# Patient Record
Sex: Female | Born: 1935 | Race: White | Hispanic: No | Smoking: Former smoker
Health system: Southern US, Community
[De-identification: ages and names within clinical notes are randomized; demographics above are authoritative.]

## PROBLEM LIST (undated history)

## (undated) DIAGNOSIS — R531 Weakness: Secondary | ICD-10-CM

## (undated) DIAGNOSIS — I639 Cerebral infarction, unspecified: Secondary | ICD-10-CM

## (undated) DIAGNOSIS — F32A Depression, unspecified: Secondary | ICD-10-CM

## (undated) DIAGNOSIS — J449 Chronic obstructive pulmonary disease, unspecified: Secondary | ICD-10-CM

## (undated) DIAGNOSIS — J309 Allergic rhinitis, unspecified: Secondary | ICD-10-CM

## (undated) DIAGNOSIS — I2699 Other pulmonary embolism without acute cor pulmonale: Secondary | ICD-10-CM

## (undated) DIAGNOSIS — R609 Edema, unspecified: Secondary | ICD-10-CM

## (undated) DIAGNOSIS — M1990 Unspecified osteoarthritis, unspecified site: Secondary | ICD-10-CM

## (undated) DIAGNOSIS — Z9989 Dependence on other enabling machines and devices: Secondary | ICD-10-CM

## (undated) DIAGNOSIS — R32 Unspecified urinary incontinence: Secondary | ICD-10-CM

## (undated) DIAGNOSIS — G629 Polyneuropathy, unspecified: Secondary | ICD-10-CM

## (undated) DIAGNOSIS — I82409 Acute embolism and thrombosis of unspecified deep veins of unspecified lower extremity: Secondary | ICD-10-CM

## (undated) DIAGNOSIS — F419 Anxiety disorder, unspecified: Secondary | ICD-10-CM

## (undated) DIAGNOSIS — E785 Hyperlipidemia, unspecified: Secondary | ICD-10-CM

## (undated) DIAGNOSIS — F329 Major depressive disorder, single episode, unspecified: Secondary | ICD-10-CM

## (undated) DIAGNOSIS — E119 Type 2 diabetes mellitus without complications: Secondary | ICD-10-CM

## (undated) DIAGNOSIS — I872 Venous insufficiency (chronic) (peripheral): Secondary | ICD-10-CM

## (undated) DIAGNOSIS — I509 Heart failure, unspecified: Secondary | ICD-10-CM

## (undated) HISTORY — PX: CHOLECYSTECTOMY: SHX55

## (undated) HISTORY — PX: APPENDECTOMY: SHX54

## (undated) HISTORY — PX: CATARACT EXTRACTION, BILATERAL: SHX1313

---

## 2012-03-14 ENCOUNTER — Emergency Department (HOSPITAL_COMMUNITY): Payer: Medicare Other

## 2012-03-14 ENCOUNTER — Emergency Department (HOSPITAL_COMMUNITY)
Admission: EM | Admit: 2012-03-14 | Discharge: 2012-03-14 | Disposition: A | Discharge status: Home / Self Care | Payer: Medicare Other | Attending: Emergency Medicine | Admitting: Emergency Medicine

## 2012-03-14 ENCOUNTER — Encounter (HOSPITAL_COMMUNITY): Payer: Self-pay | Admitting: *Deleted

## 2012-03-14 DIAGNOSIS — R609 Edema, unspecified: Secondary | ICD-10-CM | POA: Insufficient documentation

## 2012-03-14 DIAGNOSIS — W19XXXA Unspecified fall, initial encounter: Secondary | ICD-10-CM

## 2012-03-14 DIAGNOSIS — Y9210 Unspecified residential institution as the place of occurrence of the external cause: Secondary | ICD-10-CM | POA: Insufficient documentation

## 2012-03-14 DIAGNOSIS — Z86718 Personal history of other venous thrombosis and embolism: Secondary | ICD-10-CM | POA: Insufficient documentation

## 2012-03-14 DIAGNOSIS — W1809XA Striking against other object with subsequent fall, initial encounter: Secondary | ICD-10-CM | POA: Insufficient documentation

## 2012-03-14 DIAGNOSIS — I6990 Unspecified sequelae of unspecified cerebrovascular disease: Secondary | ICD-10-CM | POA: Insufficient documentation

## 2012-03-14 DIAGNOSIS — T1490XA Injury, unspecified, initial encounter: Secondary | ICD-10-CM | POA: Insufficient documentation

## 2012-03-14 DIAGNOSIS — M25579 Pain in unspecified ankle and joints of unspecified foot: Secondary | ICD-10-CM | POA: Insufficient documentation

## 2012-03-14 DIAGNOSIS — E119 Type 2 diabetes mellitus without complications: Secondary | ICD-10-CM | POA: Insufficient documentation

## 2012-03-14 DIAGNOSIS — Z7901 Long term (current) use of anticoagulants: Secondary | ICD-10-CM | POA: Insufficient documentation

## 2012-03-14 DIAGNOSIS — Z794 Long term (current) use of insulin: Secondary | ICD-10-CM | POA: Insufficient documentation

## 2012-03-14 DIAGNOSIS — S0003XA Contusion of scalp, initial encounter: Secondary | ICD-10-CM | POA: Insufficient documentation

## 2012-03-14 HISTORY — DX: Type 2 diabetes mellitus without complications: E11.9

## 2012-03-14 HISTORY — DX: Weakness: R53.1

## 2012-03-14 HISTORY — DX: Major depressive disorder, single episode, unspecified: F32.9

## 2012-03-14 HISTORY — DX: Depression, unspecified: F32.A

## 2012-03-14 HISTORY — DX: Chronic obstructive pulmonary disease, unspecified: J44.9

## 2012-03-14 HISTORY — DX: Polyneuropathy, unspecified: G62.9

## 2012-03-14 HISTORY — DX: Unspecified urinary incontinence: R32

## 2012-03-14 HISTORY — DX: Heart failure, unspecified: I50.9

## 2012-03-14 HISTORY — DX: Acute embolism and thrombosis of unspecified deep veins of unspecified lower extremity: I82.409

## 2012-03-14 HISTORY — DX: Anxiety disorder, unspecified: F41.9

## 2012-03-14 HISTORY — DX: Hyperlipidemia, unspecified: E78.5

## 2012-03-14 HISTORY — DX: Venous insufficiency (chronic) (peripheral): I87.2

## 2012-03-14 HISTORY — DX: Dependence on other enabling machines and devices: Z99.89

## 2012-03-14 HISTORY — DX: Other pulmonary embolism without acute cor pulmonale: I26.99

## 2012-03-14 HISTORY — DX: Edema, unspecified: R60.9

## 2012-03-14 HISTORY — DX: Unspecified osteoarthritis, unspecified site: M19.90

## 2012-03-14 HISTORY — DX: Allergic rhinitis, unspecified: J30.9

## 2012-03-14 HISTORY — DX: Cerebral infarction, unspecified: I63.9

## 2012-03-14 LAB — PROTIME-INR
INR: 1.88 — ABNORMAL HIGH (ref 0.00–1.49)
Prothrombin Time: 20.9 seconds — ABNORMAL HIGH (ref 11.6–15.2)

## 2012-03-14 NOTE — ED Provider Notes (Signed)
History     CSN: [id number]  Arrival date & time 03/14/12  1428   First MD Initiated Contact with Patient 03/14/12 1534      Chief Complaint  Patient presents with  . Fall    (Consider location/radiation/quality/duration/timing/severity/associated sxs/prior treatment) HPI patient fell 1 30 p.m. today attempting to transfer herself from a chair to a wheelchair. As a result fall she struck her head. No loss of consciousness her only complaint presently his left ankle pain she suffered a hematoma to her head there was a event no loss of consciousness no neck pain no other complaint. No treatment prior to coming here sent here for evaluation Past Medical History  Diagnosis Date  . Stroke   . History of blood clots   . Neuropathy   . Swelling   . General weakness   . CHF (congestive heart failure)   . CPAP (continuous positive airway pressure) dependence   . Diabetes mellitus without complication   . Allergic rhinitis   . Hyperlipidemia   . Venous insufficiency   . Urinary incontinence   . Anxiety   . Depression   . Osteoarthritis   . COPD (chronic obstructive pulmonary disease)   . DVT (deep venous thrombosis)   . PE (pulmonary embolism)     Past Surgical History  Procedure Date  . Cholecystectomy   . Appendectomy     History reviewed. No pertinent family history.  History  Substance Use Topics  . Smoking status: Never Smoker   . Smokeless tobacco: Never Used  . Alcohol Use: No    OB History    Grav Para Term Preterm Abortions TAB SAB Ect Mult Living                  Review of Systems  Musculoskeletal: Positive for arthralgias and gait problem.       Wheelchair-bound left ankle pain  All other systems reviewed and are negative.    Allergies  Pollen extract  Home Medications   Current Outpatient Rx  Name Route Sig Dispense Refill  . ACETAMINOPHEN 325 MG PO TABS Oral Take 650 mg by mouth every 6 (six) hours as needed. PAIN    . ALBUTEROL SULFATE  (2.5 MG/3ML) 0.083% IN NEBU Nebulization Take 2.5 mg by nebulization every 6 (six) hours as needed. WHEEZING AND SHORTNESS OF BREATH    . AMLODIPINE BESYLATE 5 MG PO TABS Oral Take 5 mg by mouth daily.    . BUSPIRONE HCL 15 MG PO TABS Oral Take 15 mg by mouth 2 (two) times daily.    [name] VITAMIN D 1000 UNITS PO TABS Oral Take 1,000 Units by mouth daily.    [name] DOCUSATE SODIUM 100 MG PO CAPS Oral Take 100 mg by mouth daily.    [name] ESCITALOPRAM OXALATE 10 MG PO TABS Oral Take 10 mg by mouth daily.    [name] FEXOFENADINE HCL 180 MG PO TABS Oral Take 180 mg by mouth daily.    . FUROSEMIDE 20 MG PO TABS Oral Take 20 mg by mouth daily.    . GUAIFENESIN ER 600 MG PO TB12 Oral Take 1,200 mg by mouth 2 (two) times daily.    . INSULIN GLARGINE 100 UNIT/ML [place] SOLN Subcutaneous Inject 40 Units into the skin 2 (two) times daily.    . INSULIN LISPRO (HUMAN) 100 UNIT/ML [place] SOLN Subcutaneous Inject 20 Units into the skin 3 (three) times daily before meals.    [name] LEVOTHYROXINE SODIUM 25 MCG PO TABS Oral  Take 25 mcg by mouth daily.    . MOMETASONE FUROATE 220 MCG/INH IN AEPB Inhalation Inhale 2 puffs into the lungs daily.    [name] OMEPRAZOLE 20 MG PO CPDR Oral Take 20 mg by mouth at bedtime.    . OXYBUTYNIN CHLORIDE 5 MG PO TABS Oral Take 10 mg by mouth at bedtime.    . SUCRALFATE 1 G PO TABS Oral Take 1 g by mouth at bedtime.    . SULFAMETHOXAZOLE-TMP DS 800-160 MG PO TABS Oral Take 1 tablet by mouth daily.    . TRAMADOL HCL 50 MG PO TABS Oral Take 50 mg by mouth every 4 (four) hours as needed. PAIN    . WARFARIN SODIUM 3 MG PO TABS Oral Take 3 mg by mouth daily.      BP 133/58  Pulse 100  Temp 98 F (36.7 C) (Oral)  Resp 18  SpO2 100%  Physical Exam  Nursing note and vitals reviewed. Constitutional: She is oriented to person, place, and time. She appears well-developed and well-nourished. No distress.  HENT:       Golf ball sized hematoma right forehead otherwise atraumatic  Eyes: Conjunctivae normal are normal.  Pupils are equal, round, and reactive to light.  Neck: Neck supple. No tracheal deviation present. No thyromegaly present.  Cardiovascular: Normal rate and regular rhythm.   No murmur heard. Pulmonary/Chest: Effort normal and breath sounds normal.  Abdominal: Soft. Bowel sounds are normal. She exhibits no distension. There is no tenderness.  Musculoskeletal: Normal range of motion. She exhibits no edema and no tenderness.       Bilateral lower extremities with 3+ pretibial pitting edema of lower extremity minimally tender at the medial malleolus DP pulse 2+ skin intact  Neurological: She is alert and oriented to person, place, and time. Coordination normal.  Skin: Skin is warm and dry. No rash noted.  Psychiatric: She has a normal mood and affect.    ED Course  Procedures (including critical care time)  Labs Reviewed - No data to display No results found.   No diagnosis found.  Declined pain medicine  AS0 splint placed on left ankle by orthopedic technician. Splint is comfortable for patient. Patient requests Dr. Luiz Blare for orthopedic followup ankle xray reviewed by me MDM  INR is mildly subtherapeutic however we will not adjust dose now in light of recent minor head trauma  Plan Tylenol for pain Patient called PMD tomorrow for followup Followup Dr. Luiz Blare  Diagnosis #1 fall #2 minor closed head trauma #3 closed fracture left ankle #4 subtherapeutic INR     Doug Sou, MD 03/14/12 1857

## 2012-03-14 NOTE — ED Notes (Signed)
Patient is resting comfortably. 

## 2012-03-14 NOTE — ED Notes (Signed)
Patient transported to X-ray 

## 2012-03-14 NOTE — ED Notes (Signed)
Family at bedside. 

## 2012-03-14 NOTE — ED Notes (Signed)
Pt states she has limited mobility in her R leg d/t previous stroke, was transferring from chair to chair and then landed on the floor, Pt landed face first on the floor, R side bruising and hematoma noted to forehead. Pt denies pain, blurred vision.

## 2012-03-14 NOTE — ED Notes (Signed)
MD at bedside. 

## 2012-03-14 NOTE — ED Notes (Signed)
Per EMS pt was transferring from recliner to wheelchair, slipped and hit head on wall, no complaints from pt, no lacerations or bruising noted, pt is on coumadin, pelvis/hip area hit floor first, no LOC, alert/oriented. Pt from [facility]. BP 146/72, HR 78, CBG 100 taken by staff at facility.

## 2012-03-14 NOTE — ED Notes (Signed)
AVW:[id number]<XB> Expected date:03/14/12<BR> Expected time: 2:25 PM<BR> Means of arrival:Ambulance<BR> Comments:<BR> Fall

## 2012-06-30 ENCOUNTER — Encounter (HOSPITAL_COMMUNITY): Payer: Self-pay | Admitting: Emergency Medicine

## 2012-06-30 ENCOUNTER — Emergency Department (HOSPITAL_COMMUNITY)
Admission: EM | Admit: 2012-06-30 | Discharge: 2012-06-30 | Disposition: A | Discharge status: Home / Self Care | Payer: Medicare Other | Attending: Emergency Medicine | Admitting: Emergency Medicine

## 2012-06-30 DIAGNOSIS — Z8669 Personal history of other diseases of the nervous system and sense organs: Secondary | ICD-10-CM | POA: Insufficient documentation

## 2012-06-30 DIAGNOSIS — E785 Hyperlipidemia, unspecified: Secondary | ICD-10-CM | POA: Insufficient documentation

## 2012-06-30 DIAGNOSIS — I2699 Other pulmonary embolism without acute cor pulmonale: Secondary | ICD-10-CM | POA: Insufficient documentation

## 2012-06-30 DIAGNOSIS — Z7901 Long term (current) use of anticoagulants: Secondary | ICD-10-CM | POA: Insufficient documentation

## 2012-06-30 DIAGNOSIS — Z8673 Personal history of transient ischemic attack (TIA), and cerebral infarction without residual deficits: Secondary | ICD-10-CM | POA: Insufficient documentation

## 2012-06-30 DIAGNOSIS — Z8709 Personal history of other diseases of the respiratory system: Secondary | ICD-10-CM | POA: Insufficient documentation

## 2012-06-30 DIAGNOSIS — Z86711 Personal history of pulmonary embolism: Secondary | ICD-10-CM | POA: Insufficient documentation

## 2012-06-30 DIAGNOSIS — Z79899 Other long term (current) drug therapy: Secondary | ICD-10-CM | POA: Insufficient documentation

## 2012-06-30 DIAGNOSIS — Y9210 Unspecified residential institution as the place of occurrence of the external cause: Secondary | ICD-10-CM | POA: Insufficient documentation

## 2012-06-30 DIAGNOSIS — S0990XA Unspecified injury of head, initial encounter: Secondary | ICD-10-CM | POA: Insufficient documentation

## 2012-06-30 DIAGNOSIS — R5383 Other fatigue: Secondary | ICD-10-CM | POA: Insufficient documentation

## 2012-06-30 DIAGNOSIS — Z86718 Personal history of other venous thrombosis and embolism: Secondary | ICD-10-CM | POA: Insufficient documentation

## 2012-06-30 DIAGNOSIS — Z8739 Personal history of other diseases of the musculoskeletal system and connective tissue: Secondary | ICD-10-CM | POA: Insufficient documentation

## 2012-06-30 DIAGNOSIS — I509 Heart failure, unspecified: Secondary | ICD-10-CM | POA: Insufficient documentation

## 2012-06-30 DIAGNOSIS — R32 Unspecified urinary incontinence: Secondary | ICD-10-CM | POA: Insufficient documentation

## 2012-06-30 DIAGNOSIS — F3289 Other specified depressive episodes: Secondary | ICD-10-CM | POA: Insufficient documentation

## 2012-06-30 DIAGNOSIS — F329 Major depressive disorder, single episode, unspecified: Secondary | ICD-10-CM | POA: Insufficient documentation

## 2012-06-30 DIAGNOSIS — Z794 Long term (current) use of insulin: Secondary | ICD-10-CM | POA: Insufficient documentation

## 2012-06-30 DIAGNOSIS — F411 Generalized anxiety disorder: Secondary | ICD-10-CM | POA: Insufficient documentation

## 2012-06-30 DIAGNOSIS — J449 Chronic obstructive pulmonary disease, unspecified: Secondary | ICD-10-CM | POA: Insufficient documentation

## 2012-06-30 DIAGNOSIS — J4489 Other specified chronic obstructive pulmonary disease: Secondary | ICD-10-CM | POA: Insufficient documentation

## 2012-06-30 DIAGNOSIS — Y9389 Activity, other specified: Secondary | ICD-10-CM | POA: Insufficient documentation

## 2012-06-30 DIAGNOSIS — W06XXXA Fall from bed, initial encounter: Secondary | ICD-10-CM | POA: Insufficient documentation

## 2012-06-30 DIAGNOSIS — Z8679 Personal history of other diseases of the circulatory system: Secondary | ICD-10-CM | POA: Insufficient documentation

## 2012-06-30 DIAGNOSIS — E119 Type 2 diabetes mellitus without complications: Secondary | ICD-10-CM | POA: Insufficient documentation

## 2012-06-30 DIAGNOSIS — Z87448 Personal history of other diseases of urinary system: Secondary | ICD-10-CM | POA: Insufficient documentation

## 2012-06-30 DIAGNOSIS — R5381 Other malaise: Secondary | ICD-10-CM | POA: Insufficient documentation

## 2012-06-30 NOTE — ED Notes (Signed)
Dr. Miller @ bedside.

## 2012-06-30 NOTE — ED Notes (Addendum)
Brought in by EMS from [facility] facility after pt fell off bed.  Per EMS:  Pt slid on the floor when she was getting out of bed, pt reported to staff that "she might have hit head on the carpet", pt c/o "carpet burn" on her forehead; pt arrived to ED room with LSB; pt presents to ED alert and oriented, denies pain.

## 2012-06-30 NOTE — ED Provider Notes (Signed)
History     CSN: [id number]  Arrival date & time 06/30/12  0865   First MD Initiated Contact with Patient 06/30/12 [phone]      Chief Complaint  Patient presents with  . Fall    (Consider location/radiation/quality/duration/timing/severity/associated sxs/prior treatment) HPI Comments: 79 year old female with a history of diabetes, venous thromboembolism requiring Coumadin therapy, congestive heart failure and general weakness presents from her nursing facility where she states that she had trouble getting out of bed this morning, felt like she was losing her balance and "slithered" to the floor from the edge of her bed. Paramedics were called to transfer the patient at the hospital because she bumped her head on the ground which she equally to a rug burn. The patient denies headache, blurred vision, weakness or numbness at this time and has a clear sensorium. This was acute in onset just prior to arrival, symptoms are persistent, mild and not associated with vomiting  Patient is a 79 y.o. female presenting with fall. The history is provided by the patient and the EMS personnel.  Fall    Past Medical History  Diagnosis Date  . Stroke   . History of blood clots   . Neuropathy   . Swelling   . General weakness   . CHF (congestive heart failure)   . CPAP (continuous positive airway pressure) dependence   . Diabetes mellitus without complication   . Allergic rhinitis   . Hyperlipidemia   . Venous insufficiency   . Urinary incontinence   . Anxiety   . Depression   . Osteoarthritis   . COPD (chronic obstructive pulmonary disease)   . DVT (deep venous thrombosis)   . PE (pulmonary embolism)     Past Surgical History  Procedure Date  . Cholecystectomy   . Appendectomy     No family history on file.  History  Substance Use Topics  . Smoking status: Never Smoker   . Smokeless tobacco: Never Used  . Alcohol Use: No    OB History    Grav Para Term Preterm Abortions TAB SAB  Ect Mult Living                  Review of Systems  All other systems reviewed and are negative.    Allergies  Pollen extract  Home Medications   Current Outpatient Rx  Name  Route  Sig  Dispense  Refill  . ACETAMINOPHEN 325 MG PO TABS   Oral   Take 650 mg by mouth every 6 (six) hours as needed. PAIN         . ALBUTEROL SULFATE (2.5 MG/3ML) 0.083% IN NEBU   Nebulization   Take 2.5 mg by nebulization every 6 (six) hours as needed. WHEEZING AND SHORTNESS OF BREATH         . AMLODIPINE BESYLATE 5 MG PO TABS   Oral   Take 5 mg by mouth daily.         . BUSPIRONE HCL 15 MG PO TABS   Oral   Take 15 mg by mouth 2 (two) times daily.         [name] VITAMIN D 1000 UNITS PO TABS   Oral   Take 1,000 Units by mouth daily.         [name] DOCUSATE SODIUM 100 MG PO CAPS   Oral   Take 100 mg by mouth daily.         [name] ESCITALOPRAM OXALATE 10 MG PO TABS   Oral  Take 10 mg by mouth daily.         [name] FEXOFENADINE HCL 180 MG PO TABS   Oral   Take 180 mg by mouth daily.         . FUROSEMIDE 20 MG PO TABS   Oral   Take 20 mg by mouth daily.         . GUAIFENESIN ER 600 MG PO TB12   Oral   Take 1,200 mg by mouth 2 (two) times daily.         . INSULIN GLARGINE 100 UNIT/ML [place] SOLN   Subcutaneous   Inject 40 Units into the skin 2 (two) times daily.         . INSULIN LISPRO (HUMAN) 100 UNIT/ML  SOLN   Subcutaneous   Inject 20 Units into the skin 3 (three) times daily before meals.         [name] LEVOTHYROXINE SODIUM 25 MCG PO TABS   Oral   Take 25 mcg by mouth daily.         . MOMETASONE FUROATE 220 MCG/INH IN AEPB   Inhalation   Inhale 2 puffs into the lungs daily.         [name] OMEPRAZOLE 20 MG PO CPDR   Oral   Take 20 mg by mouth at bedtime.         . OXYBUTYNIN CHLORIDE 5 MG PO TABS   Oral   Take 10 mg by mouth at bedtime.         . SUCRALFATE 1 G PO TABS   Oral   Take 1 g by mouth at bedtime.         . SULFAMETHOXAZOLE-TMP DS 800-160 MG PO  TABS   Oral   Take 1 tablet by mouth daily.         . TRAMADOL HCL 50 MG PO TABS   Oral   Take 50 mg by mouth every 4 (four) hours as needed. PAIN         . WARFARIN SODIUM 3 MG PO TABS   Oral   Take 3 mg by mouth daily.           BP 146/39  Pulse 62  Resp 21  SpO2 96%  Physical Exam  Nursing note and vitals reviewed. Constitutional: She appears well-developed and well-nourished. No distress.  HENT:  Head: Normocephalic.  Mouth/Throat: Oropharynx is clear and moist. No oropharyngeal exudate.       Minimal redness to the skin of the right 4 head, no hematoma, no laceration, no hemotympanum  no facial tenderness, deformity, malocclusion or hemotympanum.  no battle's sign or racoon eyes.   Eyes: Conjunctivae normal and EOM are normal. Pupils are equal, round, and reactive to light. Right eye exhibits no discharge. Left eye exhibits no discharge. No scleral icterus.  Neck: Normal range of motion. Neck supple. No JVD present. No thyromegaly present.  Cardiovascular: Normal rate, regular rhythm, normal heart sounds and intact distal pulses.  Exam reveals no gallop and no friction rub.   No murmur heard. Pulmonary/Chest: Effort normal and breath sounds normal. No respiratory distress. She has no wheezes. She has no rales.  Abdominal: Soft. Bowel sounds are normal. She exhibits no distension and no mass. There is no tenderness.  Musculoskeletal: Normal range of motion. She exhibits no edema and no tenderness.  Lymphadenopathy:    She has no cervical adenopathy.  Neurological: She is alert. Coordination normal.       Speech  is clear, memory is intact, follows commands with all 4 extremities, normal strength of all 4 extremities, cranial nerves III through XII are intact normal coordination and no limb ataxia  Skin: Skin is warm and dry. No rash noted. No erythema.  Psychiatric: She has a normal mood and affect. Her behavior is normal.    ED Course  Procedures (including  critical care time)  Labs Reviewed - No data to display No results found.   1. Minor head injury       MDM  Well-appearing, minor head injury, no indication for imaging at this time as the patient is completely asymptomatic. In addition though she has generalized weakness she has no focal weakness on my exam, she declines imaging stating that she feels comfortable going home, I agree with the patient at this time they recommended she return for severe or worsening symptoms including weakness, change in vision, worsening headache.        Vida Roller, MD 06/30/12 [phone]

## 2012-06-30 NOTE — ED Notes (Signed)
Bed:WA15<BR> Expected date:<BR> Expected time:<BR> Means of arrival:<BR> Comments:<BR> EMS

## 2012-12-09 ENCOUNTER — Emergency Department (HOSPITAL_COMMUNITY)
Admission: EM | Admit: 2012-12-09 | Discharge: 2012-12-09 | Disposition: A | Discharge status: Home / Self Care | Payer: Medicare Other | Attending: Emergency Medicine | Admitting: Emergency Medicine

## 2012-12-09 ENCOUNTER — Encounter (HOSPITAL_COMMUNITY): Payer: Self-pay | Admitting: Emergency Medicine

## 2012-12-09 ENCOUNTER — Emergency Department (HOSPITAL_COMMUNITY): Payer: Medicare Other

## 2012-12-09 DIAGNOSIS — Z86718 Personal history of other venous thrombosis and embolism: Secondary | ICD-10-CM | POA: Insufficient documentation

## 2012-12-09 DIAGNOSIS — F329 Major depressive disorder, single episode, unspecified: Secondary | ICD-10-CM | POA: Insufficient documentation

## 2012-12-09 DIAGNOSIS — F3289 Other specified depressive episodes: Secondary | ICD-10-CM | POA: Insufficient documentation

## 2012-12-09 DIAGNOSIS — Z79899 Other long term (current) drug therapy: Secondary | ICD-10-CM | POA: Insufficient documentation

## 2012-12-09 DIAGNOSIS — Z8673 Personal history of transient ischemic attack (TIA), and cerebral infarction without residual deficits: Secondary | ICD-10-CM | POA: Insufficient documentation

## 2012-12-09 DIAGNOSIS — Y9210 Unspecified residential institution as the place of occurrence of the external cause: Secondary | ICD-10-CM | POA: Insufficient documentation

## 2012-12-09 DIAGNOSIS — S0990XA Unspecified injury of head, initial encounter: Secondary | ICD-10-CM | POA: Insufficient documentation

## 2012-12-09 DIAGNOSIS — Z86711 Personal history of pulmonary embolism: Secondary | ICD-10-CM | POA: Insufficient documentation

## 2012-12-09 DIAGNOSIS — Z8679 Personal history of other diseases of the circulatory system: Secondary | ICD-10-CM | POA: Insufficient documentation

## 2012-12-09 DIAGNOSIS — Z8739 Personal history of other diseases of the musculoskeletal system and connective tissue: Secondary | ICD-10-CM | POA: Insufficient documentation

## 2012-12-09 DIAGNOSIS — J449 Chronic obstructive pulmonary disease, unspecified: Secondary | ICD-10-CM | POA: Insufficient documentation

## 2012-12-09 DIAGNOSIS — I509 Heart failure, unspecified: Secondary | ICD-10-CM | POA: Insufficient documentation

## 2012-12-09 DIAGNOSIS — Y92129 Unspecified place in nursing home as the place of occurrence of the external cause: Secondary | ICD-10-CM

## 2012-12-09 DIAGNOSIS — Z794 Long term (current) use of insulin: Secondary | ICD-10-CM | POA: Insufficient documentation

## 2012-12-09 DIAGNOSIS — Z87448 Personal history of other diseases of urinary system: Secondary | ICD-10-CM | POA: Insufficient documentation

## 2012-12-09 DIAGNOSIS — E785 Hyperlipidemia, unspecified: Secondary | ICD-10-CM | POA: Insufficient documentation

## 2012-12-09 DIAGNOSIS — E119 Type 2 diabetes mellitus without complications: Secondary | ICD-10-CM | POA: Insufficient documentation

## 2012-12-09 DIAGNOSIS — Z8709 Personal history of other diseases of the respiratory system: Secondary | ICD-10-CM | POA: Insufficient documentation

## 2012-12-09 DIAGNOSIS — Y939 Activity, unspecified: Secondary | ICD-10-CM | POA: Insufficient documentation

## 2012-12-09 DIAGNOSIS — F411 Generalized anxiety disorder: Secondary | ICD-10-CM | POA: Insufficient documentation

## 2012-12-09 DIAGNOSIS — J4489 Other specified chronic obstructive pulmonary disease: Secondary | ICD-10-CM | POA: Insufficient documentation

## 2012-12-09 DIAGNOSIS — W050XXA Fall from non-moving wheelchair, initial encounter: Secondary | ICD-10-CM | POA: Insufficient documentation

## 2012-12-09 DIAGNOSIS — Z7901 Long term (current) use of anticoagulants: Secondary | ICD-10-CM | POA: Insufficient documentation

## 2012-12-09 LAB — GLUCOSE, CAPILLARY: Glucose-Capillary: 86 mg/dL (ref 70–99)

## 2012-12-09 NOTE — ED Notes (Signed)
Pt's son doesn't understand why we are doing collecting BMP and starting an IV and dont want her to be stuck until the see the EDP.  Pt's son requesting to speak with EDP. Made Nanavati aware.

## 2012-12-09 NOTE — ED Notes (Signed)
Pt from [facility] with c/o of fall while transferring to wheelchair. Staff thinks pt hit head on floor. Hx of dementia. NAD at this time.

## 2012-12-09 NOTE — ED Notes (Signed)
After alerting RN Lyla Son that patient's cbg was 39,patient given orange juice and [place] sandwich.

## 2012-12-09 NOTE — ED Provider Notes (Signed)
History    CSN: [id number] Arrival date & time 12/09/12  1222  First MD Initiated Contact with Patient 12/09/12 1237     Chief Complaint  Patient presents with  . Fall   (Consider location/radiation/quality/duration/timing/severity/associated sxs/prior Treatment) HPI Comments: Pt comes in with cc of fall. Pt had a mechanical fall at nursing home, while transferring to wheel chair. Pt did hit her head, and she is on coumadin. Pt has no headaches, nausea, vomiting, visual complains, seizures, altered mental status, loss of consciousness, new weakness, or numbness, no gait instability. Pt has no pain elsewhere.   Patient is a 79 y.o. female presenting with fall. The history is provided by the patient.  Fall Pertinent negatives include no chest pain, no abdominal pain, no headaches and no shortness of breath.   Past Medical History  Diagnosis Date  . Stroke   . History of blood clots   . Neuropathy   . Swelling   . General weakness   . CHF (congestive heart failure)   . CPAP (continuous positive airway pressure) dependence   . Diabetes mellitus without complication   . Allergic rhinitis   . Hyperlipidemia   . Venous insufficiency   . Urinary incontinence   . Anxiety   . Depression   . Osteoarthritis   . COPD (chronic obstructive pulmonary disease)   . DVT (deep venous thrombosis)   . PE (pulmonary embolism)    Past Surgical History  Procedure Laterality Date  . Cholecystectomy    . Appendectomy     No family history on file. History  Substance Use Topics  . Smoking status: Never Smoker   . Smokeless tobacco: Never Used  . Alcohol Use: No   OB History   Grav Para Term Preterm Abortions TAB SAB Ect Mult Living                 Review of Systems  Constitutional: Negative for activity change.  HENT: Negative for neck pain.   Respiratory: Negative for shortness of breath.   Cardiovascular: Negative for chest pain.  Gastrointestinal: Negative for nausea,  vomiting and abdominal pain.  Genitourinary: Negative for dysuria.  Musculoskeletal: Negative for back pain and arthralgias.  Neurological: Negative for headaches.    Allergies  Pollen extract  Home Medications   Current Outpatient Rx  Name  Route  Sig  Dispense  Refill  . acetaminophen (TYLENOL) 500 MG tablet   Oral   Take 1,000 mg by mouth every 6 (six) hours as needed for pain or fever.         [name] albuterol (PROVENTIL) (2.5 MG/3ML) 0.083% nebulizer solution   Nebulization   Take 2.5 mg by nebulization every 6 (six) hours as needed for wheezing or shortness of breath.          [name] amLODipine (NORVASC) 5 MG tablet   Oral   Take 5 mg by mouth every morning.          . busPIRone (BUSPAR) 15 MG tablet   Oral   Take 15 mg by mouth 2 (two) times daily.         . cholecalciferol (VITAMIN D) 1000 UNITS tablet   Oral   Take 1,000 Units by mouth every morning.          . cyclobenzaprine (FLEXERIL) 10 MG tablet   Oral   Take 10 mg by mouth at bedtime as needed (pain).         [name] docusate sodium (COLACE) 100 MG  capsule   Oral   Take 100 mg by mouth every morning.          . escitalopram (LEXAPRO) 10 MG tablet   Oral   Take 10 mg by mouth every morning.          . fexofenadine (ALLEGRA) 180 MG tablet   Oral   Take 180 mg by mouth daily as needed (allergies).          . Fluticasone-Salmeterol (ADVAIR) 250-50 MCG/DOSE AEPB   Inhalation   Inhale 1 puff into the lungs 2 (two) times daily. Rinse and spit after each use.         . furosemide (LASIX) 20 MG tablet   Oral   Take 20 mg by mouth every morning.          [name] guaiFENesin (MUCINEX) 600 MG 12 hr tablet   Oral   Take 1,200 mg by mouth 2 (two) times daily as needed for congestion.          [name] guaifenesin (ROBITUSSIN) 100 MG/5ML syrup   Oral   Take 100 mg by mouth every 4 (four) hours as needed for cough.         . insulin glargine (LANTUS) 100 UNIT/ML injection   Subcutaneous   Inject 35 Units  into the skin 2 (two) times daily.          . insulin lispro (HUMALOG) 100 UNIT/ML injection   Subcutaneous   Inject 0-110 Units into the skin 2 (two) times daily. Sliding Scale Insulin, 6:30 am and 8:30 pm         . insulin lispro (HUMALOG) 100 UNIT/ML injection   Subcutaneous   Inject 20 Units into the skin 3 (three) times daily. 7:30 am, 11:30 am, 4:30 pm         . levothyroxine (SYNTHROID, LEVOTHROID) 25 MCG tablet   Oral   Take 25 mcg by mouth every morning.          [name] omeprazole (PRILOSEC) 20 MG capsule   Oral   Take 20 mg by mouth at bedtime.         [name] oxybutynin (DITROPAN) 5 MG tablet   Oral   Take 10 mg by mouth at bedtime.         . simvastatin (ZOCOR) 10 MG tablet   Oral   Take 10 mg by mouth every evening.         . sucralfate (CARAFATE) 1 G tablet   Oral   Take 1 g by mouth at bedtime.         . sulfamethoxazole-trimethoprim (BACTRIM DS) 800-160 MG per tablet   Oral   Take 1 tablet by mouth every morning.          . traMADol (ULTRAM) 50 MG tablet   Oral   Take 50 mg by mouth every 4 (four) hours as needed for pain.          [name] triamcinolone (NASACORT) 55 MCG/ACT nasal inhaler   Nasal   Place 1 spray into the nose at bedtime.         [name] warfarin (COUMADIN) 3 MG tablet   Oral   Take 3 mg by mouth every evening. Take at 4pm.          BP 123/40  Pulse 70  Temp(Src) 98 F (36.7 C) (Oral)  Resp 18  SpO2 97% Physical Exam  Nursing note and vitals reviewed. Constitutional: She is oriented to person, place, and time.  She appears well-developed and well-nourished.  HENT:  Head: Normocephalic and atraumatic.  Eyes: EOM are normal. Pupils are equal, round, and reactive to light.  Neck: Neck supple.  No midline c-spine tenderness, pt able to turn head to 45 degrees bilaterally without any pain and able to flex neck to the chest and extend without any pain or neurologic symptoms.   Cardiovascular: Normal rate, regular rhythm and  normal heart sounds.   No murmur heard. Pulmonary/Chest: Effort normal. No respiratory distress.  Abdominal: Soft. She exhibits no distension. There is no tenderness. There is no rebound and no guarding.  Musculoskeletal:  Head to toe evaluation shows no hematoma, bleeding of the scalp, no facial abrasions, step offs, crepitus, no tenderness to palpation of the bilateral upper and lower extremities, no gross deformities, no chest tenderness, no pelvic pain.   Neurological: She is alert and oriented to person, place, and time.  Skin: Skin is warm and dry.    ED Course  Procedures (including critical care time) Labs Reviewed  URINALYSIS, ROUTINE W REFLEX MICROSCOPIC   No results found. No diagnosis found.  MDM  DDx includes: - Mechanical falls - ICH - Fractures - Contusions - Soft tissue injury  Pt comes in with cc of fall. Pt is on coumadin. Post fall, she has had no concerning symptoms. We will get CT head, as she in on coumadin. Her cspine was cleared clinically.   Derwood Kaplan, MD 12/09/12 1357

## 2012-12-09 NOTE — Progress Notes (Signed)
Pt confirms pcp is MORAITIS, [name] EPIC updated

## 2012-12-09 NOTE — Progress Notes (Signed)
CSW met with pt and pt son at bedside. Pt and pt son confirmed patient is a resident at [facility] and plans to return when medically stable.   Catha Gosselin, LCSWA  [phone] 12/09/2012 1548pm

## 2013-02-02 ENCOUNTER — Emergency Department (HOSPITAL_COMMUNITY)
Admission: EM | Admit: 2013-02-02 | Discharge: 2013-02-02 | Disposition: A | Discharge status: Home / Self Care | Payer: Medicare Other | Attending: Emergency Medicine | Admitting: Emergency Medicine

## 2013-02-02 ENCOUNTER — Encounter (HOSPITAL_COMMUNITY): Payer: Self-pay

## 2013-02-02 ENCOUNTER — Emergency Department (HOSPITAL_COMMUNITY): Payer: Medicare Other

## 2013-02-02 DIAGNOSIS — F411 Generalized anxiety disorder: Secondary | ICD-10-CM | POA: Insufficient documentation

## 2013-02-02 DIAGNOSIS — E785 Hyperlipidemia, unspecified: Secondary | ICD-10-CM | POA: Insufficient documentation

## 2013-02-02 DIAGNOSIS — S0990XA Unspecified injury of head, initial encounter: Secondary | ICD-10-CM | POA: Insufficient documentation

## 2013-02-02 DIAGNOSIS — N390 Urinary tract infection, site not specified: Secondary | ICD-10-CM | POA: Insufficient documentation

## 2013-02-02 DIAGNOSIS — Z86718 Personal history of other venous thrombosis and embolism: Secondary | ICD-10-CM | POA: Insufficient documentation

## 2013-02-02 DIAGNOSIS — S2249XA Multiple fractures of ribs, unspecified side, initial encounter for closed fracture: Secondary | ICD-10-CM | POA: Insufficient documentation

## 2013-02-02 DIAGNOSIS — Z7901 Long term (current) use of anticoagulants: Secondary | ICD-10-CM | POA: Insufficient documentation

## 2013-02-02 DIAGNOSIS — I509 Heart failure, unspecified: Secondary | ICD-10-CM | POA: Insufficient documentation

## 2013-02-02 DIAGNOSIS — J4489 Other specified chronic obstructive pulmonary disease: Secondary | ICD-10-CM | POA: Insufficient documentation

## 2013-02-02 DIAGNOSIS — R296 Repeated falls: Secondary | ICD-10-CM | POA: Insufficient documentation

## 2013-02-02 DIAGNOSIS — Z86711 Personal history of pulmonary embolism: Secondary | ICD-10-CM | POA: Insufficient documentation

## 2013-02-02 DIAGNOSIS — Z794 Long term (current) use of insulin: Secondary | ICD-10-CM | POA: Insufficient documentation

## 2013-02-02 DIAGNOSIS — Z8673 Personal history of transient ischemic attack (TIA), and cerebral infarction without residual deficits: Secondary | ICD-10-CM | POA: Insufficient documentation

## 2013-02-02 DIAGNOSIS — E119 Type 2 diabetes mellitus without complications: Secondary | ICD-10-CM | POA: Insufficient documentation

## 2013-02-02 DIAGNOSIS — F329 Major depressive disorder, single episode, unspecified: Secondary | ICD-10-CM | POA: Insufficient documentation

## 2013-02-02 DIAGNOSIS — Y9229 Other specified public building as the place of occurrence of the external cause: Secondary | ICD-10-CM | POA: Insufficient documentation

## 2013-02-02 DIAGNOSIS — Y9389 Activity, other specified: Secondary | ICD-10-CM | POA: Insufficient documentation

## 2013-02-02 DIAGNOSIS — M1990 Unspecified osteoarthritis, unspecified site: Secondary | ICD-10-CM | POA: Insufficient documentation

## 2013-02-02 DIAGNOSIS — S2231XA Fracture of one rib, right side, initial encounter for closed fracture: Secondary | ICD-10-CM

## 2013-02-02 DIAGNOSIS — F3289 Other specified depressive episodes: Secondary | ICD-10-CM | POA: Insufficient documentation

## 2013-02-02 DIAGNOSIS — J449 Chronic obstructive pulmonary disease, unspecified: Secondary | ICD-10-CM | POA: Insufficient documentation

## 2013-02-02 DIAGNOSIS — Z79899 Other long term (current) drug therapy: Secondary | ICD-10-CM | POA: Insufficient documentation

## 2013-02-02 DIAGNOSIS — Z8669 Personal history of other diseases of the nervous system and sense organs: Secondary | ICD-10-CM | POA: Insufficient documentation

## 2013-02-02 LAB — URINALYSIS, ROUTINE W REFLEX MICROSCOPIC
Glucose, UA: NEGATIVE mg/dL
Protein, ur: NEGATIVE mg/dL
Specific Gravity, Urine: 1.012 (ref 1.005–1.030)
Urobilinogen, UA: 0.2 mg/dL (ref 0.0–1.0)

## 2013-02-02 LAB — CBC WITH DIFFERENTIAL/PLATELET
Basophils Absolute: 0 10*3/uL (ref 0.0–0.1)
Eosinophils Relative: 1 % (ref 0–5)
HCT: 31.9 % — ABNORMAL LOW (ref 36.0–46.0)
Hemoglobin: 10.1 g/dL — ABNORMAL LOW (ref 12.0–15.0)
Lymphocytes Relative: 14 % (ref 12–46)
Lymphs Abs: 1.7 10*3/uL (ref 0.7–4.0)
MCV: 87.9 fL (ref 78.0–100.0)
Monocytes Absolute: 1 10*3/uL (ref 0.1–1.0)
Monocytes Relative: 8 % (ref 3–12)
Neutro Abs: 9.4 10*3/uL — ABNORMAL HIGH (ref 1.7–7.7)
RBC: 3.63 MIL/uL — ABNORMAL LOW (ref 3.87–5.11)
RDW: 15.2 % (ref 11.5–15.5)
WBC: 12.2 10*3/uL — ABNORMAL HIGH (ref 4.0–10.5)

## 2013-02-02 LAB — URINE MICROSCOPIC-ADD ON

## 2013-02-02 LAB — BASIC METABOLIC PANEL
CO2: 29 mEq/L (ref 19–32)
Chloride: 99 mEq/L (ref 96–112)
Creatinine, Ser: 1.26 mg/dL — ABNORMAL HIGH (ref 0.50–1.10)
Glucose, Bld: 111 mg/dL — ABNORMAL HIGH (ref 70–99)

## 2013-02-02 LAB — PROTIME-INR: Prothrombin Time: 19.3 seconds — ABNORMAL HIGH (ref 11.6–15.2)

## 2013-02-02 MED ORDER — HYDROCODONE-ACETAMINOPHEN 5-325 MG PO TABS: 1.0000 | ORAL_TABLET | Freq: Four times a day (QID) | ORAL | Status: DC | PRN

## 2013-02-02 MED ORDER — IBUPROFEN 200 MG PO TABS
400.0000 mg | ORAL_TABLET | Freq: Once | ORAL | Status: DC
  Filled 2013-02-02: qty 2

## 2013-02-02 MED ORDER — CEPHALEXIN 500 MG PO CAPS
500.0000 mg | ORAL_CAPSULE | Freq: Once | ORAL | Status: AC
  Administered 2013-02-02: 500 mg via ORAL
  Filled 2013-02-02: qty 1

## 2013-02-02 MED ORDER — MORPHINE SULFATE 4 MG/ML IJ SOLN
4.0000 mg | Freq: Once | INTRAMUSCULAR | Status: AC
  Administered 2013-02-02: 4 mg via INTRAVENOUS
  Filled 2013-02-02: qty 1

## 2013-02-02 MED ORDER — HYDROCODONE-ACETAMINOPHEN 5-325 MG PO TABS
1.0000 | ORAL_TABLET | Freq: Once | ORAL | Status: AC
  Administered 2013-02-02: 1 via ORAL
  Filled 2013-02-02: qty 1

## 2013-02-02 MED ORDER — DEXTROSE 5 % IV SOLN
1.0000 g | Freq: Once | INTRAVENOUS | Status: DC
  Filled 2013-02-02: qty 10

## 2013-02-02 MED ORDER — METHOCARBAMOL 500 MG PO TABS: 500.0000 mg | ORAL_TABLET | Freq: Once | ORAL | Status: DC

## 2013-02-02 MED ORDER — CEPHALEXIN 500 MG PO CAPS: 500.0000 mg | ORAL_CAPSULE | Freq: Two times a day (BID) | ORAL | Status: DC

## 2013-02-02 NOTE — ED Notes (Signed)
Patient unable to move legs off bed due to pain. RN Velna Hatchet and MD [facility] notified.

## 2013-02-02 NOTE — ED Notes (Signed)
Patient's son rude to Clinical research associate. When attempting to give medications ordered, patient's son stated he did not want her to have Motrin. Stated, "IF you people wuold read her history you would know she had ulcers. Patient's son also refused for the patient to have Robaxin. He stated, "what does this pill have to do with broken ribs." Explained to him about muscle relaxants. He again refused and stated that people around here did not know what they were doing. He said, "All I want is to get the prescriptions and get the hell out of here." The patient is still complaining of left rib pan 9/10 and increases with movement. Patient's son also refused for the patient to have Rocephin. Stating it was expensive and all he wanted was to have a prescription for her to take home.

## 2013-02-02 NOTE — ED Notes (Signed)
Patient transported to CT 

## 2013-02-02 NOTE — ED Notes (Addendum)
Per EMS-Patient is a resident of [facility]. Patient was at the beauty shop and lost her footing-falling and hitting the right side of her head and right rib area. No deformities, bruising , or redness. Pain is worse with a deep breath. No LOC.

## 2013-02-02 NOTE — ED Provider Notes (Addendum)
CSN: [id number]     Arrival date & time 02/02/13  1016 History   First MD Initiated Contact with Patient 02/02/13 1028     Chief Complaint  Patient presents with  . Fall   (Consider location/radiation/quality/duration/timing/severity/associated sxs/prior Treatment) HPI Comments: Pt comes in with cc of fall. 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 She fell onto her red side, and has some right sided back pain. She is on coumadin. She admits to hitting her head, but has headaches, loc, nausea, vomiting, visual complains, seizures, altered mental status, new weakness, or numbness, no gait instability.  Patient is a 79 y.o. female presenting with fall. The history is provided by the patient and medical records.  Fall Associated symptoms include chest pain and headaches. Pertinent negatives include no abdominal pain and no shortness of breath.    Past Medical History  Diagnosis Date  . Stroke   . History of blood clots   . Neuropathy   . Swelling   . General weakness   . CHF (congestive heart failure)   . CPAP (continuous positive airway pressure) dependence   . Diabetes mellitus without complication   . Allergic rhinitis   . Hyperlipidemia   . Venous insufficiency   . Urinary incontinence   . Anxiety   . Depression   . Osteoarthritis   . COPD (chronic obstructive pulmonary disease)   . DVT (deep venous thrombosis)   . PE (pulmonary embolism)    Past Surgical History  Procedure Laterality Date  . Cholecystectomy    . Appendectomy     History reviewed. No pertinent family history. History  Substance Use Topics  . Smoking status: Never Smoker   . Smokeless tobacco: Never Used  . Alcohol Use: No   OB History   Grav Para Term Preterm Abortions TAB SAB Ect Mult Living                 Review of Systems  Constitutional: Negative for activity change.  HENT: Negative for neck pain.   Respiratory: Negative for shortness of breath.   Cardiovascular: Positive for chest  pain.  Gastrointestinal: Negative for nausea, vomiting and abdominal pain.  Genitourinary: Negative for dysuria.  Musculoskeletal: Positive for arthralgias.  Neurological: Positive for headaches.    Allergies  Pollen extract  Home Medications   Current Outpatient Rx  Name  Route  Sig  Dispense  Refill  . acetaminophen (TYLENOL) 500 MG tablet   Oral   Take 1,000 mg by mouth every 6 (six) hours as needed for pain or fever.         [name] albuterol (PROVENTIL) (2.5 MG/3ML) 0.083% nebulizer solution   Nebulization   Take 2.5 mg by nebulization every 6 (six) hours as needed for wheezing or shortness of breath.          [name] amLODipine (NORVASC) 5 MG tablet   Oral   Take 5 mg by mouth every morning.          . busPIRone (BUSPAR) 15 MG tablet   Oral   Take 15 mg by mouth 2 (two) times daily.         . cholecalciferol (VITAMIN D) 1000 UNITS tablet   Oral   Take 1,000 Units by mouth every morning.          . cyclobenzaprine (FLEXERIL) 10 MG tablet   Oral   Take 10 mg by mouth at bedtime as needed (pain).         [name]  docusate sodium (COLACE) 100 MG capsule   Oral   Take 100 mg by mouth every morning.          . escitalopram (LEXAPRO) 10 MG tablet   Oral   Take 10 mg by mouth every morning.          . fexofenadine (ALLEGRA) 180 MG tablet   Oral   Take 180 mg by mouth daily as needed (allergies).          . Fluticasone-Salmeterol (ADVAIR) 250-50 MCG/DOSE AEPB   Inhalation   Inhale 1 puff into the lungs 2 (two) times daily. Rinse and spit after each use.         . furosemide (LASIX) 20 MG tablet   Oral   Take 20 mg by mouth every morning.          [name] guaiFENesin (MUCINEX) 600 MG 12 hr tablet   Oral   Take 1,200 mg by mouth 2 (two) times daily as needed for congestion.          [name] guaifenesin (ROBITUSSIN) 100 MG/5ML syrup   Oral   Take 100 mg by mouth every 4 (four) hours as needed for cough.         . insulin glargine (LANTUS) 100 UNIT/ML injection    Subcutaneous   Inject 35 Units into the skin 2 (two) times daily.          . insulin lispro (HUMALOG) 100 UNIT/ML injection   Subcutaneous   Inject 0-110 Units into the skin 2 (two) times daily. Sliding Scale Insulin, 6:30 am and 8:30 pm         . insulin lispro (HUMALOG) 100 UNIT/ML injection   Subcutaneous   Inject 20 Units into the skin 3 (three) times daily. 7:30 am, 11:30 am, 4:30 pm         . levothyroxine (SYNTHROID, LEVOTHROID) 25 MCG tablet   Oral   Take 25 mcg by mouth every morning.          [name] omeprazole (PRILOSEC) 20 MG capsule   Oral   Take 20 mg by mouth at bedtime.         [name] oxybutynin (DITROPAN) 5 MG tablet   Oral   Take 10 mg by mouth at bedtime.         . simvastatin (ZOCOR) 10 MG tablet   Oral   Take 10 mg by mouth every evening.         . sucralfate (CARAFATE) 1 G tablet   Oral   Take 1 g by mouth at bedtime.         . sulfamethoxazole-trimethoprim (BACTRIM DS) 800-160 MG per tablet   Oral   Take 1 tablet by mouth every morning.          . traMADol (ULTRAM) 50 MG tablet   Oral   Take 50 mg by mouth every 4 (four) hours as needed for pain.          [name] triamcinolone (NASACORT) 55 MCG/ACT nasal inhaler   Nasal   Place 2 sprays into the nose at bedtime.          [name] warfarin (COUMADIN) 3 MG tablet   Oral   Take 3 mg by mouth every evening. Take at 4pm.          BP 140/51  Pulse 68  Temp(Src) 97.8 F (36.6 C) (Oral)  Resp 15  SpO2 96% Physical Exam  Nursing note and vitals reviewed. Constitutional: She is  oriented to person, place, and time. She appears well-developed and well-nourished.  HENT:  Head: Normocephalic and atraumatic.  No midline c-spine tenderness, pt able to turn head to 45 degrees bilaterally without any pain and able to flex neck to the chest and extend without any pain or neurologic symptoms.   Eyes: EOM are normal. Pupils are equal, round, and reactive to light.  Neck: Neck supple.  Cardiovascular:  Normal rate, regular rhythm and normal heart sounds.   No murmur heard. Pulmonary/Chest: Effort normal. No respiratory distress.  Abdominal: Soft. She exhibits no distension. There is no tenderness. There is no rebound and no guarding.  Musculoskeletal:  Head to toe evaluation shows no hematoma, bleeding of the scalp, no facial abrasions, step offs, crepitus, no tenderness to palpation of the bilateral upper and lower extremities, no gross deformities, no chest tenderness, no pelvic pain.   Neurological: She is alert and oriented to person, place, and time.  Skin: Skin is warm and dry.    ED Course  Procedures (including critical care time) Labs Review Labs Reviewed  CBC WITH DIFFERENTIAL - Abnormal; Notable for the following:    WBC 12.2 (*)    RBC 3.63 (*)    Hemoglobin 10.1 (*)    HCT 31.9 (*)    Neutro Abs 9.4 (*)    All other components within normal limits  BASIC METABOLIC PANEL - Abnormal; Notable for the following:    Glucose, Bld 111 (*)    Creatinine, Ser 1.26 (*)    GFR calc non Af Amer 38 (*)    GFR calc Af Amer 44 (*)    All other components within normal limits  PROTIME-INR - Abnormal; Notable for the following:    Prothrombin Time 19.3 (*)    INR 1.68 (*)    All other components within normal limits  APTT  URINALYSIS, ROUTINE W REFLEX MICROSCOPIC   Imaging Review Dg Ribs Unilateral W/chest Right  02/02/2013   *RADIOLOGY REPORT*  Clinical Data: Post fall, now with lateral and posterior right- sided rib pain  RIGHT RIBS AND CHEST - 3+ VIEW  Comparison: None.  Findings:  Examination is degraded secondary to patient body habitus.  Enlarged cardiac silhouette and mediastinal contours.  Apparent veiling opacities overlying the bilateral lower lungs are favored to be secondary to overlying breast tissue.  There is mild slightly nodular interstitial thickening.  No focal airspace opacities.  No pleural effusion or pneumothorax. No definite evidence of edema.  There are  minimally displaced fractures involving the posterior lateral aspect of the right seventh and eighth ribs.  An IVC filter overlies the upper abdomen. No radiopaque foreign body.  IMPRESSION: 1.  Minimally displaced fractures of the posterior lateral aspects of the right seventh and eighth ribs without associated pneumothorax. 2.  Mild bronchitic change without acute cardiopulmonary disease.  3. Cardiomegaly.   Original Report Authenticated By: Tacey Ruiz, MD    MDM  No diagnosis found.  DDx includes: - Mechanical falls - ICH - Fractures - Contusions - Soft tissue injury   Ultrasound limited abdominal and limited transthoracic ultrasound (FAST)  Indication: Fall, right sided pain, on coumadin, rule out bleed. Four views were obtained using the low frequency transducer: Splenorenal, Hepatorenal, Retrovesical, Pericardial subxyphoid, Parasternal long axis. Interpretation: No free fluid visualized surrounding the kidneys, pelvis or pericardium. Images archived electronically Dr. Rhunette Croft personally performed and interpreted the images    Pt comes in with cc of fall. Pt's exam is + for right sided  rib pain, lower thorax pain. FAST negative, hemodynamically stable. Don't suspect internal bleeding, will get serial exams. Xray chest and CT head ordered.     Derwood Kaplan, MD 02/02/13 1226  2:39 PM Ambulated with assitance. Has UTI, and has rib fractures. Will d.c her back to the assisted living.  Derwood Kaplan, MD 02/02/13 1439

## 2013-02-05 ENCOUNTER — Telehealth (HOSPITAL_COMMUNITY): Payer: Self-pay | Admitting: Emergency Medicine

## 2013-02-05 LAB — URINE CULTURE

## 2013-02-05 NOTE — ED Notes (Signed)
Post ED Visit - Positive Culture Follow-up  Culture report reviewed by antimicrobial stewardship pharmacist: []  Wes Dulaney, Pharm.D., BCPS []  Celedonio Miyamoto, Pharm.D., BCPS []  Georgina Pillion, Pharm.D., BCPS []  [place], [address].D., BCPS, AAHIVP []  Estella Husk, Pharm.D., BCPS, AAHIVP [x]  Lysle Pearl, Pharm.D., BCPS  Positive urine culture Treated with Keflex, organism sensitive to the same and no further patient follow-up is required at this time.  Kylie A Holland 02/05/2013, 10:56 AM

## 2013-02-06 ENCOUNTER — Telehealth (HOSPITAL_COMMUNITY): Payer: Self-pay | Admitting: Emergency Medicine

## 2013-02-06 NOTE — ED Notes (Signed)
Post ED Visit - Positive Culture Follow-up  Culture report reviewed by antimicrobial stewardship pharmacist: []  Wes Dulaney, Pharm.D., BCPS []  Celedonio Miyamoto, Pharm.D., BCPS []  Georgina Pillion, Pharm.D., BCPS []  [place], [address].D., BCPS, AAHIVP []  Estella Husk, Pharm.D., BCPS, AAHIVP [x]  Abran Duke, [address].D., BCPS  Positive urine culture Treated with Keflex, organism sensitive to the same and no further patient follow-up is required at this time.  Kylie A Holland 02/06/2013, 10:46 AM

## 2013-06-07 ENCOUNTER — Ambulatory Visit (INDEPENDENT_AMBULATORY_CARE_PROVIDER_SITE_OTHER): Payer: Medicare Other | Admitting: Neurology

## 2013-06-07 ENCOUNTER — Encounter: Payer: Self-pay | Admitting: Neurology

## 2013-06-07 VITALS — BP 120/46 | HR 64 | Temp 98.3°F | Resp 16

## 2013-06-07 DIAGNOSIS — E1149 Type 2 diabetes mellitus with other diabetic neurological complication: Secondary | ICD-10-CM

## 2013-06-07 DIAGNOSIS — F0150 Vascular dementia without behavioral disturbance: Secondary | ICD-10-CM

## 2013-06-07 DIAGNOSIS — E1142 Type 2 diabetes mellitus with diabetic polyneuropathy: Secondary | ICD-10-CM

## 2013-06-07 DIAGNOSIS — R269 Unspecified abnormalities of gait and mobility: Secondary | ICD-10-CM

## 2013-06-07 MED ORDER — DONEPEZIL HCL 5 MG PO TABS: 5.0000 mg | ORAL_TABLET | Freq: Every day | ORAL | Status: DC

## 2013-06-07 MED ORDER — DONEPEZIL HCL 10 MG PO TABS: 10.0000 mg | ORAL_TABLET | Freq: Every day | ORAL | Status: DC

## 2013-06-07 NOTE — Progress Notes (Signed)
Courtney Montgomery was seen today in the movement disorders clinic for neurologic consultation at the request of MORAITIS, [name], PA-C.  The consultation is for the evaluation of "parkinsons like symptoms."  Unfortunately, the pt is unaware of why she is here.  Referring notes are unrevealing.  No one accompanies her    Specific Symptoms:  Tremor: no Voice: only gotten "deeper", not hypophonic Sleep: sleeps well with CPAP  Vivid Dreams:  yes  Acting out dreams:  no Wet Pillows: no Postural symptoms:  yes, poor balance x years.  Using [facility] for several years.  Uses walker some at home  Falls?  yes  (last a month ago when transferring from [facility] to walker) Bradykinesia symptoms: difficulty regaining balance; some trouble in evening Loss of smell:  no (i'm an "acute smeller") Loss of taste:  no Urinary Incontinence:  yes Difficulty Swallowing:  yes (can get choked up) Handwriting, micrographia: yes Trouble with ADL's:  yes ("I can't do that.  I feel that I had a stroke 4-5 years ago that caused these things)  Trouble buttoning clothing: yes Depression:  yes (intermittent) Memory changes:  yes (trouble with names; lives at [facility] and they feed/bathe/clothe pt.  Been there 1.5 years.  Felt that went to [facility] because of memory.  Prior to that had 24 hour day care and it got expensive.  Trouble with word finding.  Memory is thing that disturbs her most and asks if "there is a pill for that.") Hallucinations:  no  visual distortions: no N/V:  no Lightheaded:  no  Syncope: no Diplopia: sometimes when looks out a specific window but thinks that it is the glass in the window.  She never has it when looking elsewhere Dyskinesia:  no    ALLERGIES:   Allergies  Allergen Reactions  . Pollen Extract Other (See Comments)    unknown    CURRENT MEDICATIONS:  Current Outpatient Prescriptions on File Prior to Visit  Medication Sig Dispense Refill  . acetaminophen (TYLENOL) 500 MG tablet Take 1,000 mg  by mouth every 6 (six) hours as needed for pain or fever.      [name] albuterol (PROVENTIL) (2.5 MG/3ML) 0.083% nebulizer solution Take 2.5 mg by nebulization every 6 (six) hours as needed for wheezing or shortness of breath.       [name] amLODipine (NORVASC) 5 MG tablet Take 5 mg by mouth every morning.       . busPIRone (BUSPAR) 15 MG tablet Take 15 mg by mouth 2 (two) times daily.      . cholecalciferol (VITAMIN D) 1000 UNITS tablet Take 1,000 Units by mouth every morning.       . cyclobenzaprine (FLEXERIL) 10 MG tablet Take 10 mg by mouth at bedtime as needed (pain).      [name] docusate sodium (COLACE) 100 MG capsule Take 100 mg by mouth every morning.       . escitalopram (LEXAPRO) 10 MG tablet Take 10 mg by mouth every morning.       . fexofenadine (ALLEGRA) 180 MG tablet Take 180 mg by mouth daily as needed (allergies).       . Fluticasone-Salmeterol (ADVAIR) 250-50 MCG/DOSE AEPB Inhale 1 puff into the lungs 2 (two) times daily. Rinse and spit after each use.      . furosemide (LASIX) 20 MG tablet Take 20 mg by mouth every morning.       [name] guaiFENesin (MUCINEX) 600 MG 12 hr tablet Take 1,200 mg by mouth  2 (two) times daily as needed for congestion.       [name]. guaifenesin (ROBITUSSIN) 100 MG/5ML syrup Take 100 mg by mouth every 4 (four) hours as needed for cough.      . insulin glargine (LANTUS) 100 UNIT/ML injection Inject 35 Units into the skin 2 (two) times daily.       . insulin lispro (HUMALOG) 100 UNIT/ML injection Inject 0-110 Units into the skin 2 (two) times daily. Sliding Scale Insulin, 6:30 am and 8:30 pm      . insulin lispro (HUMALOG) 100 UNIT/ML injection Inject 20 Units into the skin 3 (three) times daily. 7:30 am, 11:30 am, 4:30 pm      . levothyroxine (SYNTHROID, LEVOTHROID) 25 MCG tablet Take 25 mcg by mouth every morning.       [name]. omeprazole (PRILOSEC) 20 MG capsule Take 20 mg by mouth at bedtime.      [name]. oxybutynin (DITROPAN) 5 MG tablet Take 10 mg by mouth at bedtime.      . simvastatin  (ZOCOR) 10 MG tablet Take 10 mg by mouth every evening.      . sucralfate (CARAFATE) 1 G tablet Take 1 g by mouth at bedtime.      . traMADol (ULTRAM) 50 MG tablet Take 50 mg by mouth every 4 (four) hours as needed for pain.       [name]. triamcinolone (NASACORT) 55 MCG/ACT nasal inhaler Place 2 sprays into the nose at bedtime.       [name]. warfarin (COUMADIN) 3 MG tablet Take 3 mg by mouth every evening. Take at 4pm.       No current facility-administered medications on file prior to visit.    PAST MEDICAL HISTORY:   Past Medical History  Diagnosis Date  . Stroke   . Neuropathy   . Swelling   . General weakness   . CHF (congestive heart failure)   . CPAP (continuous positive airway pressure) dependence   . Diabetes mellitus without complication   . Allergic rhinitis   . Hyperlipidemia   . Venous insufficiency   . Urinary incontinence   . Anxiety   . Depression   . Osteoarthritis   . COPD (chronic obstructive pulmonary disease)   . DVT (deep venous thrombosis)   . PE (pulmonary embolism)     PAST SURGICAL HISTORY:   Past Surgical History  Procedure Laterality Date  . Cholecystectomy    . Appendectomy    . Cataract extraction, bilateral      SOCIAL HISTORY:   History   Social History  . Marital Status: Widowed    Spouse Name: N/A    Number of Children: N/A  . Years of Education: N/A   Occupational History  . retired     Print production planneroffice manager   Social History Main Topics  . Smoking status: Former Games developermoker  . Smokeless tobacco: Never Used     Comment: quit 1991  . Alcohol Use: No  . Drug Use: No  . Sexual Activity: No   Other Topics Concern  . Not on file   Social History Narrative  . No narrative on file    FAMILY HISTORY:   Family Status  Relation Status Death Age  . Mother Deceased 6655    ? heart or renal failure  . Father Deceased 6884    CHF  . Brother Alive     healthy  . Son Alive     2, healthy    ROS:  A complete 10  system review of systems was obtained  and was unremarkable apart from what is mentioned above.  PHYSICAL EXAMINATION:    VITALS:   Filed Vitals:   06/07/13 1424  BP: 120/46  Pulse: 64  Temp: 98.3 F (36.8 C)  Resp: 16    GEN:  The patient appears stated age and is in NAD. HEENT:  Normocephalic, atraumatic.  The mucous membranes are moist. The superficial temporal arteries are without ropiness or tenderness. CV:  RRR Lungs:  CTAB Neck/HEME:  There are no carotid bruits bilaterally.  Neurological examination:  Orientation: The patient is alert and oriented x3. No problems with month/date/year.  19/30 on MoCA.  Trouble with trail making. Cranial nerves: There is good facial symmetry. Pupils are equal.  Fundoscopic exam is attempted but the disc margins are not well visualized bilaterally. Extraocular muscles are intact. The visual fields are full to confrontational testing. The speech is fluent and clear. Soft palate rises symmetrically and there is no tongue deviation. Hearing is intact to conversational tone. Sensation: Sensation is intact to light and pinprick throughout (facial, trunk, extremities). Pinprick decreased in stocking distribution.  Vibration is absent at the bilateral big toe. There is no extinction with double simultaneous stimulation. There is no sensory dermatomal level identified. Motor: Strength is 5/5 in the bilateral upper and lower extremities.   Shoulder shrug is equal and symmetric.  There is no pronator drift. Deep tendon reflexes: Deep tendon reflexes are 2/4 at the bilateral biceps, triceps, brachioradialis, but absent at the bilateral patella and achilles. Plantar responses are downgoing bilaterally.  Movement examination: Tone: There is normal tone in the bilateral upper extremities.  The tone in the lower extremities is normal.  Abnormal movements: None Coordination:  There is no significant decremation with RAM's Gait and Station: The patient has very significant difficulty arising out of a  chair.  It requires maximal assistance.  Even with one-person on each side of her, she is not stable and can only take a few steps.   ASSESSMENT/PLAN:  1.  I see no evidence of idiopathic Parkinson's disease or one of the Parkinson's plus syndromes.  -I discussed this with her today.  She was very pleased. 2.  memory changes, likely a vascular dementia.  -This is overall fairly mild.  She has multiple vascular risk factors.  We decided to try Aricept 5 mg daily for a month and then increase to 10 mg daily.  Risks, benefits, side effects and alternative therapies were discussed.  The opportunity to ask questions was given and they were answered to the best of my ability.  The patient expressed understanding and willingness to follow the outlined treatment protocols. 3.  Gait changes  -I think this is multifactorial.  I think that deconditioning plays a large role.  I also think that diabetic peripheral neuropathy plays a role.  It is likely that arthritic issues play a role as well.  Nonetheless, I do not think that she is parkinsonian, which was the reason for the referral.  -She just got started in physical therapy, which I am hopeful will be beneficial.  She found it beneficial in the past. 4.  The patient would like to continue care here, so I will follow her in regards to memory changes.

## 2015-04-03 ENCOUNTER — Encounter (HOSPITAL_COMMUNITY): Payer: Self-pay | Admitting: Emergency Medicine

## 2015-04-03 ENCOUNTER — Inpatient Hospital Stay (HOSPITAL_COMMUNITY)
Admission: EM | Admit: 2015-04-03 | Discharge: 2015-04-07 | DRG: 811 | Disposition: A | Discharge status: Nursing Home (Includes ICF) | Payer: Medicare Other | Attending: Internal Medicine | Admitting: Internal Medicine

## 2015-04-03 DIAGNOSIS — N390 Urinary tract infection, site not specified: Secondary | ICD-10-CM | POA: Diagnosis present

## 2015-04-03 DIAGNOSIS — Z794 Long term (current) use of insulin: Secondary | ICD-10-CM

## 2015-04-03 DIAGNOSIS — E785 Hyperlipidemia, unspecified: Secondary | ICD-10-CM | POA: Diagnosis present

## 2015-04-03 DIAGNOSIS — D649 Anemia, unspecified: Secondary | ICD-10-CM | POA: Diagnosis present

## 2015-04-03 DIAGNOSIS — D631 Anemia in chronic kidney disease: Secondary | ICD-10-CM | POA: Diagnosis present

## 2015-04-03 DIAGNOSIS — Z79899 Other long term (current) drug therapy: Secondary | ICD-10-CM | POA: Diagnosis not present

## 2015-04-03 DIAGNOSIS — N183 Chronic kidney disease, stage 3 unspecified: Secondary | ICD-10-CM | POA: Diagnosis present

## 2015-04-03 DIAGNOSIS — T502X5A Adverse effect of carbonic-anhydrase inhibitors, benzothiadiazides and other diuretics, initial encounter: Secondary | ICD-10-CM | POA: Diagnosis present

## 2015-04-03 DIAGNOSIS — J449 Chronic obstructive pulmonary disease, unspecified: Secondary | ICD-10-CM | POA: Diagnosis present

## 2015-04-03 DIAGNOSIS — Z7901 Long term (current) use of anticoagulants: Secondary | ICD-10-CM

## 2015-04-03 DIAGNOSIS — N179 Acute kidney failure, unspecified: Secondary | ICD-10-CM | POA: Diagnosis present

## 2015-04-03 DIAGNOSIS — E039 Hypothyroidism, unspecified: Secondary | ICD-10-CM | POA: Diagnosis present

## 2015-04-03 DIAGNOSIS — R195 Other fecal abnormalities: Secondary | ICD-10-CM | POA: Diagnosis present

## 2015-04-03 DIAGNOSIS — I2699 Other pulmonary embolism without acute cor pulmonale: Secondary | ICD-10-CM | POA: Diagnosis present

## 2015-04-03 DIAGNOSIS — I129 Hypertensive chronic kidney disease with stage 1 through stage 4 chronic kidney disease, or unspecified chronic kidney disease: Secondary | ICD-10-CM | POA: Diagnosis present

## 2015-04-03 DIAGNOSIS — Z79891 Long term (current) use of opiate analgesic: Secondary | ICD-10-CM | POA: Diagnosis not present

## 2015-04-03 DIAGNOSIS — E1140 Type 2 diabetes mellitus with diabetic neuropathy, unspecified: Secondary | ICD-10-CM | POA: Diagnosis present

## 2015-04-03 DIAGNOSIS — Z87891 Personal history of nicotine dependence: Secondary | ICD-10-CM

## 2015-04-03 DIAGNOSIS — M1990 Unspecified osteoarthritis, unspecified site: Secondary | ICD-10-CM | POA: Diagnosis present

## 2015-04-03 DIAGNOSIS — I872 Venous insufficiency (chronic) (peripheral): Secondary | ICD-10-CM | POA: Diagnosis present

## 2015-04-03 DIAGNOSIS — F0390 Unspecified dementia without behavioral disturbance: Secondary | ICD-10-CM | POA: Diagnosis present

## 2015-04-03 DIAGNOSIS — N189 Chronic kidney disease, unspecified: Secondary | ICD-10-CM

## 2015-04-03 DIAGNOSIS — Z8673 Personal history of transient ischemic attack (TIA), and cerebral infarction without residual deficits: Secondary | ICD-10-CM | POA: Diagnosis not present

## 2015-04-03 DIAGNOSIS — Z8249 Family history of ischemic heart disease and other diseases of the circulatory system: Secondary | ICD-10-CM

## 2015-04-03 DIAGNOSIS — I82409 Acute embolism and thrombosis of unspecified deep veins of unspecified lower extremity: Secondary | ICD-10-CM | POA: Diagnosis present

## 2015-04-03 DIAGNOSIS — E0840 Diabetes mellitus due to underlying condition with diabetic neuropathy, unspecified: Secondary | ICD-10-CM | POA: Diagnosis not present

## 2015-04-03 DIAGNOSIS — F329 Major depressive disorder, single episode, unspecified: Secondary | ICD-10-CM | POA: Diagnosis present

## 2015-04-03 DIAGNOSIS — I509 Heart failure, unspecified: Secondary | ICD-10-CM | POA: Diagnosis present

## 2015-04-03 DIAGNOSIS — E1122 Type 2 diabetes mellitus with diabetic chronic kidney disease: Secondary | ICD-10-CM | POA: Diagnosis present

## 2015-04-03 DIAGNOSIS — Z86718 Personal history of other venous thrombosis and embolism: Secondary | ICD-10-CM

## 2015-04-03 DIAGNOSIS — I10 Essential (primary) hypertension: Secondary | ICD-10-CM | POA: Diagnosis present

## 2015-04-03 DIAGNOSIS — E1169 Type 2 diabetes mellitus with other specified complication: Secondary | ICD-10-CM | POA: Diagnosis present

## 2015-04-03 DIAGNOSIS — Z6841 Body Mass Index (BMI) 40.0 and over, adult: Secondary | ICD-10-CM

## 2015-04-03 DIAGNOSIS — T464X5A Adverse effect of angiotensin-converting-enzyme inhibitors, initial encounter: Secondary | ICD-10-CM | POA: Diagnosis present

## 2015-04-03 DIAGNOSIS — F419 Anxiety disorder, unspecified: Secondary | ICD-10-CM | POA: Diagnosis present

## 2015-04-03 DIAGNOSIS — F32A Depression, unspecified: Secondary | ICD-10-CM | POA: Diagnosis present

## 2015-04-03 DIAGNOSIS — E1340 Other specified diabetes mellitus with diabetic neuropathy, unspecified: Secondary | ICD-10-CM | POA: Diagnosis not present

## 2015-04-03 LAB — CBC
HCT: 22.1 % — ABNORMAL LOW (ref 36.0–46.0)
Hemoglobin: 6.3 g/dL — CL (ref 12.0–15.0)
MCH: 21.7 pg — ABNORMAL LOW (ref 26.0–34.0)
MCHC: 28.5 g/dL — ABNORMAL LOW (ref 30.0–36.0)
MCV: 76.2 fL — ABNORMAL LOW (ref 78.0–100.0)
PLATELETS: 287 10*3/uL (ref 150–400)
RBC: 2.9 MIL/uL — ABNORMAL LOW (ref 3.87–5.11)
RDW: 17.6 % — AB (ref 11.5–15.5)
WBC: 9.6 10*3/uL (ref 4.0–10.5)

## 2015-04-03 LAB — COMPREHENSIVE METABOLIC PANEL
ALT: 11 U/L — ABNORMAL LOW (ref 14–54)
AST: 11 U/L — AB (ref 15–41)
Albumin: 3.6 g/dL (ref 3.5–5.0)
Alkaline Phosphatase: 50 U/L (ref 38–126)
Anion gap: 7 (ref 5–15)
BILIRUBIN TOTAL: 0.2 mg/dL — AB (ref 0.3–1.2)
BUN: 21 mg/dL — AB (ref 6–20)
CO2: 25 mmol/L (ref 22–32)
CREATININE: 1.42 mg/dL — AB (ref 0.44–1.00)
Calcium: 9.2 mg/dL (ref 8.9–10.3)
Chloride: 102 mmol/L (ref 101–111)
GFR calc Af Amer: 38 mL/min — ABNORMAL LOW (ref >60)
GFR, EST NON AFRICAN AMERICAN: 33 mL/min — AB (ref >60)
Glucose, Bld: 263 mg/dL — ABNORMAL HIGH (ref 65–99)
POTASSIUM: 4 mmol/L (ref 3.5–5.1)
Sodium: 134 mmol/L — ABNORMAL LOW (ref 135–145)
Total Protein: 7 g/dL (ref 6.5–8.1)

## 2015-04-03 LAB — CBC WITH DIFFERENTIAL/PLATELET

## 2015-04-03 LAB — POC OCCULT BLOOD, ED: Fecal Occult Bld: NEGATIVE

## 2015-04-03 LAB — GLUCOSE, CAPILLARY: GLUCOSE-CAPILLARY: 214 mg/dL — AB (ref 65–99)

## 2015-04-03 LAB — PREPARE RBC (CROSSMATCH)

## 2015-04-03 LAB — ABO/RH: ABO/RH(D): A POS

## 2015-04-03 LAB — TROPONIN I: Troponin I: <0.03 ng/mL (ref <0.031)

## 2015-04-03 LAB — PROTIME-INR
INR: 1.89 — ABNORMAL HIGH (ref 0.00–1.49)
Prothrombin Time: 21.7 seconds — ABNORMAL HIGH (ref 11.6–15.2)

## 2015-04-03 MED ORDER — SODIUM CHLORIDE 0.9 % IV SOLN: INTRAVENOUS | Status: DC

## 2015-04-03 MED ORDER — ESCITALOPRAM OXALATE 20 MG PO TABS
20.0000 mg | ORAL_TABLET | Freq: Every morning | ORAL | Status: DC
  Administered 2015-04-04 – 2015-04-07 (×4): 20 mg via ORAL
  Filled 2015-04-03 (×4): qty 1

## 2015-04-03 MED ORDER — OXYCODONE-ACETAMINOPHEN 5-325 MG PO TABS: 1.0000 | ORAL_TABLET | ORAL | Status: DC | PRN

## 2015-04-03 MED ORDER — PANTOPRAZOLE SODIUM 40 MG PO TBEC
40.0000 mg | DELAYED_RELEASE_TABLET | Freq: Two times a day (BID) | ORAL | Status: DC
  Administered 2015-04-03 – 2015-04-07 (×8): 40 mg via ORAL
  Filled 2015-04-03 (×10): qty 1

## 2015-04-03 MED ORDER — LORATADINE 10 MG PO TABS
10.0000 mg | ORAL_TABLET | Freq: Every day | ORAL | Status: DC
  Administered 2015-04-04 – 2015-04-07 (×4): 10 mg via ORAL
  Filled 2015-04-03 (×4): qty 1

## 2015-04-03 MED ORDER — GUAIFENESIN 100 MG/5ML PO SYRP
100.0000 mg | ORAL_SOLUTION | ORAL | Status: DC | PRN
  Filled 2015-04-03: qty 5

## 2015-04-03 MED ORDER — ACETAMINOPHEN 500 MG PO TABS: 1000.0000 mg | ORAL_TABLET | Freq: Four times a day (QID) | ORAL | Status: DC | PRN

## 2015-04-03 MED ORDER — INSULIN ASPART 100 UNIT/ML ~~LOC~~ SOLN
0.0000 [IU] | Freq: Three times a day (TID) | SUBCUTANEOUS | Status: DC
  Administered 2015-04-04 (×3): 3 [IU] via SUBCUTANEOUS
  Administered 2015-04-05 (×2): 2 [IU] via SUBCUTANEOUS
  Administered 2015-04-06: 3 [IU] via SUBCUTANEOUS
  Administered 2015-04-06: 2 [IU] via SUBCUTANEOUS
  Administered 2015-04-07: 3 [IU] via SUBCUTANEOUS
  Administered 2015-04-07: 5 [IU] via SUBCUTANEOUS

## 2015-04-03 MED ORDER — DONEPEZIL HCL 10 MG PO TABS: 10.0000 mg | ORAL_TABLET | Freq: Every day | ORAL | Status: DC

## 2015-04-03 MED ORDER — ONDANSETRON HCL 4 MG/2ML IJ SOLN
4.0000 mg | Freq: Four times a day (QID) | INTRAMUSCULAR | Status: DC | PRN
Start: 2015-04-03 — End: 2015-04-07
  Administered 2015-04-04: 4 mg via INTRAVENOUS
  Filled 2015-04-03: qty 2

## 2015-04-03 MED ORDER — AMLODIPINE BESYLATE 5 MG PO TABS
5.0000 mg | ORAL_TABLET | Freq: Every morning | ORAL | Status: DC
  Administered 2015-04-03 – 2015-04-07 (×4): 5 mg via ORAL
  Filled 2015-04-03 (×4): qty 1

## 2015-04-03 MED ORDER — SUCRALFATE 1 G PO TABS
1.0000 g | ORAL_TABLET | Freq: Every day | ORAL | Status: DC
  Administered 2015-04-03 – 2015-04-06 (×4): 1 g via ORAL
  Filled 2015-04-03 (×5): qty 1

## 2015-04-03 MED ORDER — MOMETASONE FURO-FORMOTEROL FUM 100-5 MCG/ACT IN AERO
2.0000 | INHALATION_SPRAY | Freq: Two times a day (BID) | RESPIRATORY_TRACT | Status: DC
  Administered 2015-04-04 – 2015-04-07 (×6): 2 via RESPIRATORY_TRACT
  Filled 2015-04-03: qty 8.8

## 2015-04-03 MED ORDER — GUAIFENESIN ER 600 MG PO TB12: 1200.0000 mg | ORAL_TABLET | Freq: Two times a day (BID) | ORAL | Status: DC | PRN

## 2015-04-03 MED ORDER — INSULIN GLARGINE 100 UNIT/ML ~~LOC~~ SOLN
30.0000 [IU] | Freq: Every day | SUBCUTANEOUS | Status: DC
  Administered 2015-04-04: 30 [IU] via SUBCUTANEOUS
  Filled 2015-04-03 (×2): qty 0.3

## 2015-04-03 MED ORDER — ONDANSETRON HCL 4 MG PO TABS: 4.0000 mg | ORAL_TABLET | Freq: Four times a day (QID) | ORAL | Status: DC | PRN

## 2015-04-03 MED ORDER — BUSPIRONE HCL 15 MG PO TABS
15.0000 mg | ORAL_TABLET | Freq: Two times a day (BID) | ORAL | Status: DC
  Administered 2015-04-03 – 2015-04-07 (×8): 15 mg via ORAL
  Filled 2015-04-03 (×9): qty 1

## 2015-04-03 MED ORDER — SODIUM CHLORIDE 0.9 % IV SOLN
INTRAVENOUS | Status: AC
  Administered 2015-04-03 – 2015-04-04 (×2): via INTRAVENOUS

## 2015-04-03 MED ORDER — SIMVASTATIN 5 MG PO TABS
5.0000 mg | ORAL_TABLET | Freq: Every evening | ORAL | Status: DC
  Administered 2015-04-03 – 2015-04-06 (×4): 5 mg via ORAL
  Filled 2015-04-03: qty 1
  Filled 2015-04-03 (×2): qty 0.5
  Filled 2015-04-03 (×3): qty 1

## 2015-04-03 MED ORDER — DOCUSATE SODIUM 100 MG PO CAPS
100.0000 mg | ORAL_CAPSULE | Freq: Every morning | ORAL | Status: DC
  Administered 2015-04-04 – 2015-04-07 (×4): 100 mg via ORAL
  Filled 2015-04-03 (×5): qty 1

## 2015-04-03 MED ORDER — CYCLOBENZAPRINE HCL 10 MG PO TABS: 10.0000 mg | ORAL_TABLET | Freq: Every evening | ORAL | Status: DC | PRN

## 2015-04-03 MED ORDER — FLUTICASONE PROPIONATE 50 MCG/ACT NA SUSP
1.0000 | Freq: Every day | NASAL | Status: DC
  Administered 2015-04-04 – 2015-04-07 (×4): 1 via NASAL
  Filled 2015-04-03 (×2): qty 16

## 2015-04-03 MED ORDER — VITAMIN D3 25 MCG (1000 UNIT) PO TABS
1000.0000 [IU] | ORAL_TABLET | Freq: Every morning | ORAL | Status: DC
  Administered 2015-04-04 – 2015-04-07 (×4): 1000 [IU] via ORAL
  Filled 2015-04-03 (×4): qty 1

## 2015-04-03 MED ORDER — SODIUM CHLORIDE 0.9 % IJ SOLN
3.0000 mL | Freq: Two times a day (BID) | INTRAMUSCULAR | Status: DC
  Administered 2015-04-04 – 2015-04-07 (×5): 3 mL via INTRAVENOUS

## 2015-04-03 MED ORDER — DEXTROSE 5 % IV SOLN
1.0000 g | INTRAVENOUS | Status: DC
  Administered 2015-04-03: 1 g via INTRAVENOUS
  Filled 2015-04-03: qty 10

## 2015-04-03 MED ORDER — DONEPEZIL HCL 5 MG PO TABS
5.0000 mg | ORAL_TABLET | Freq: Every day | ORAL | Status: DC
  Administered 2015-04-03 – 2015-04-06 (×4): 5 mg via ORAL
  Filled 2015-04-03 (×5): qty 1

## 2015-04-03 MED ORDER — ALBUTEROL SULFATE (2.5 MG/3ML) 0.083% IN NEBU
2.5000 mg | INHALATION_SOLUTION | Freq: Four times a day (QID) | RESPIRATORY_TRACT | Status: DC | PRN
  Administered 2015-04-04: 2.5 mg via RESPIRATORY_TRACT
  Filled 2015-04-03: qty 3

## 2015-04-03 MED ORDER — LEVOTHYROXINE SODIUM 25 MCG PO TABS
25.0000 ug | ORAL_TABLET | Freq: Every day | ORAL | Status: DC
  Administered 2015-04-04 – 2015-04-07 (×4): 25 ug via ORAL
  Filled 2015-04-03 (×7): qty 1

## 2015-04-03 MED ORDER — INSULIN GLARGINE 100 UNIT/ML ~~LOC~~ SOLN
28.0000 [IU] | Freq: Every day | SUBCUTANEOUS | Status: DC
  Administered 2015-04-03 – 2015-04-04 (×2): 28 [IU] via SUBCUTANEOUS
  Filled 2015-04-03 (×2): qty 0.28

## 2015-04-03 MED ORDER — SODIUM CHLORIDE 0.9 % IV SOLN: Freq: Once | INTRAVENOUS | Status: DC

## 2015-04-03 MED ORDER — OXYBUTYNIN CHLORIDE 5 MG PO TABS
10.0000 mg | ORAL_TABLET | Freq: Every day | ORAL | Status: DC
  Administered 2015-04-03 – 2015-04-06 (×4): 10 mg via ORAL
  Filled 2015-04-03 (×5): qty 2

## 2015-04-03 MED ORDER — INSULIN ASPART 100 UNIT/ML ~~LOC~~ SOLN
20.0000 [IU] | Freq: Three times a day (TID) | SUBCUTANEOUS | Status: DC
  Administered 2015-04-04 (×3): 20 [IU] via SUBCUTANEOUS

## 2015-04-03 MED ORDER — ESCITALOPRAM OXALATE 10 MG PO TABS: 10.0000 mg | ORAL_TABLET | Freq: Every morning | ORAL | Status: DC

## 2015-04-03 MED ORDER — INSULIN GLARGINE 100 UNIT/ML ~~LOC~~ SOLN
35.0000 [IU] | Freq: Two times a day (BID) | SUBCUTANEOUS | Status: DC
  Filled 2015-04-03: qty 0.35

## 2015-04-03 MED ORDER — SODIUM CHLORIDE 0.9 % IV SOLN
Freq: Once | INTRAVENOUS | Status: DC
Start: 2015-04-03 — End: 2015-04-03

## 2015-04-03 NOTE — ED Provider Notes (Signed)
CSN: [id number]     Arrival date & time 04/03/15  1324 History   First MD Initiated Contact with Patient 04/03/15 1455     Chief Complaint  Patient presents with  . Hemoglobin 6.3      (Consider location/radiation/quality/duration/timing/severity/associated sxs/prior Treatment) HPI Comments: Patient from assisted living with low hemoglobin of 6.  Unclear why labs were being drawn.  Patient also hemoccult positive. She denies any red or black stools as she is bedbound and does not see them herself. She denies any nausea or vomiting. She denies any chest pain or shortness of breath. She feels well and has no symptoms related to her anemia. No dizziness or lightheadedness. No chest pain or shortness of breath. She is on Coumadin for history of DVT. Denies any previous history of GI bleed. She's never had a colonoscopy. She has some shortness of breath at baseline for her COPD which is unchanged.[email or web address]   The history is provided by the patient and the EMS personnel.    Past Medical History  Diagnosis Date  . Stroke (HCC)   . Neuropathy (HCC)   . Swelling   . General weakness   . CHF (congestive heart failure) (HCC)   . CPAP (continuous positive airway pressure) dependence   . Diabetes mellitus without complication (HCC)   . Allergic rhinitis   . Hyperlipidemia   . Venous insufficiency   . Urinary incontinence   . Anxiety   . Depression   . Osteoarthritis   . COPD (chronic obstructive pulmonary disease) (HCC)   . DVT (deep venous thrombosis) (HCC)   . PE (pulmonary embolism)    Past Surgical History  Procedure Laterality Date  . Cholecystectomy    . Appendectomy    . Cataract extraction, bilateral     No family history on file. Social History  Substance Use Topics  . Smoking status: Former Games developermoker  . Smokeless tobacco: Never Used     Comment: quit 1991  . Alcohol Use: No   OB History    No data available     Review of Systems  Constitutional: Negative for fever, activity  change, appetite change and fatigue.  HENT: Negative for congestion.   Respiratory: Positive for shortness of breath. Negative for cough and chest tightness.   Cardiovascular: Negative for chest pain.  Gastrointestinal: Positive for blood in stool. Negative for nausea, vomiting, abdominal pain and diarrhea.  Genitourinary: Negative for dysuria and hematuria.  Musculoskeletal: Negative for myalgias, arthralgias and neck pain.  Neurological: Positive for weakness. Negative for dizziness, light-headedness and headaches.   A complete 10 system review of systems was obtained and all systems are negative except as noted in the HPI and PMH.     Allergies  Pollen extract  Home Medications   Prior to Admission medications   Medication Sig Start Date End Date Taking? Authorizing Provider  acetaminophen (TYLENOL) 325 MG tablet Take 650 mg by mouth 3 (three) times daily.   Yes Historical Provider, MD  albuterol (PROVENTIL HFA;VENTOLIN HFA) 108 (90 BASE) MCG/ACT inhaler Inhale 2 puffs into the lungs every 6 (six) hours as needed for wheezing or shortness of breath.   Yes Historical Provider, MD  cholecalciferol (VITAMIN D) 1000 UNITS tablet Take 1,000 Units by mouth every morning.    Yes Historical Provider, MD  Cranberry 450 MG TABS Take 1 tablet by mouth 2 (two) times daily.   Yes Historical Provider, MD  diclofenac sodium (VOLTAREN) 1 % GEL Apply 2 g topically 2 (two) times  daily.   Yes Historical Provider, MD  donepezil (ARICEPT) 5 MG tablet Take 1 tablet (5 mg total) by mouth at bedtime. 06/07/13  Yes Rebecca S Tat, DO  escitalopram (LEXAPRO) 20 MG tablet Take 20 mg by mouth daily with breakfast.   Yes Historical Provider, MD  fexofenadine (ALLEGRA) 60 MG tablet Take 60 mg by mouth daily with breakfast.   Yes Historical Provider, MD  fluticasone (FLONASE) 50 MCG/ACT nasal spray Place 1 spray into both nostrils at bedtime.   Yes Historical Provider, MD  Fluticasone-Salmeterol (ADVAIR) 250-50  MCG/DOSE AEPB Inhale 1 puff into the lungs 2 (two) times daily. Rinse and spit after each use.   Yes Historical Provider, MD  furosemide (LASIX) 40 MG tablet Take 40 mg by mouth daily with breakfast.   Yes Historical Provider, MD  insulin glargine (LANTUS) 100 UNIT/ML injection Inject 28-30 Units into the skin 2 (two) times daily. Takes 30 units in the morning and 28 units at bedtime   Yes Historical Provider, MD  insulin lispro (HUMALOG KWIKPEN) 100 UNIT/ML KiwkPen Inject 20 Units into the skin 3 (three) times daily.   Yes Historical Provider, MD  levothyroxine (SYNTHROID, LEVOTHROID) 25 MCG tablet Take 25 mcg by mouth every evening.    Yes Historical Provider, MD  lisinopril (PRINIVIL,ZESTRIL) 5 MG tablet Take 5 mg by mouth daily with breakfast.   Yes Historical Provider, MD  nystatin (MYCOSTATIN/NYSTOP) 100000 UNIT/GM POWD Apply 1 g topically daily with breakfast. Apply under breasts   Yes Historical Provider, MD  omeprazole (PRILOSEC) 40 MG capsule Take 40 mg by mouth 2 (two) times daily.   Yes Historical Provider, MD  simvastatin (ZOCOR) 5 MG tablet Take 5 mg by mouth at bedtime.   Yes Historical Provider, MD  sucralfate (CARAFATE) 1 G tablet Take 1 g by mouth at bedtime.   Yes Historical Provider, MD  tiotropium (SPIRIVA) 18 MCG inhalation capsule Place 18 mcg into inhaler and inhale at bedtime.   Yes Historical Provider, MD  traMADol (ULTRAM) 50 MG tablet Take 50 mg by mouth 2 (two) times daily as needed (for pain).    Yes Historical Provider, MD  warfarin (COUMADIN) 1 MG tablet Take 0.5 mg by mouth daily. Take half tablet daily on Sunday Tuesday Thursday Saturday along with 3 mg    Historical Provider, MD  warfarin (COUMADIN) 3 MG tablet Take 3 mg by mouth every evening. Take at 4pm.    Historical Provider, MD   BP 132/40 mmHg  Pulse 71  Temp(Src) 97.8 F (36.6 C) (Oral)  Resp 18  Ht 5' 6.5" (1.689 m)  Wt 254 lb 13.6 oz (115.6 kg)  BMI 40.52 kg/m2  SpO2 98% Physical Exam   Constitutional: She is oriented to person, place, and time. She appears well-developed and well-nourished. No distress.  HENT:  Head: Normocephalic and atraumatic.  Mouth/Throat: Oropharynx is clear and moist. No oropharyngeal exudate.  Eyes: Conjunctivae and EOM are normal. Pupils are equal, round, and reactive to light.  Pale conjunctiva  Neck: Normal range of motion. Neck supple.  No meningismus.  Cardiovascular: Normal rate, regular rhythm, normal heart sounds and intact distal pulses.   No murmur heard. Pulmonary/Chest: Effort normal and breath sounds normal. No respiratory distress. She has no wheezes. She exhibits no tenderness.  Abdominal: Soft. There is no tenderness. There is no rebound and no guarding.  Genitourinary:  Dark stool, no gross blood, chaperone present  Musculoskeletal: Normal range of motion. She exhibits no edema or tenderness.  Neurological:  She is alert and oriented to person, place, and time. No cranial nerve deficit. She exhibits normal muscle tone. Coordination normal.  Bed bound, moving all extremities  Skin: Skin is warm.  Psychiatric: She has a normal mood and affect. Her behavior is normal.  Nursing note and vitals reviewed.   ED Course  Procedures (including critical care time) Labs Review Labs Reviewed  COMPREHENSIVE METABOLIC PANEL - Abnormal; Notable for the following:    Sodium 134 (*)    Glucose, Bld 263 (*)    BUN 21 (*)    Creatinine, Ser 1.42 (*)    AST 11 (*)    ALT 11 (*)    Total Bilirubin 0.2 (*)    GFR calc non Af Amer 33 (*)    GFR calc Af Amer 38 (*)    All other components within normal limits  CBC - Abnormal; Notable for the following:    RBC 2.90 (*)    Hemoglobin 6.3 (*)    HCT 22.1 (*)    MCV 76.2 (*)    MCH 21.7 (*)    MCHC 28.5 (*)    RDW 17.6 (*)    All other components within normal limits  PROTIME-INR - Abnormal; Notable for the following:    Prothrombin Time 21.7 (*)    INR 1.89 (*)    All other components  within normal limits  GLUCOSE, CAPILLARY - Abnormal; Notable for the following:    Glucose-Capillary 214 (*)    All other components within normal limits  TROPONIN I  CBC WITH DIFFERENTIAL/PLATELET  MAGNESIUM  PHOSPHORUS  TSH  HEMOGLOBIN A1C  COMPREHENSIVE METABOLIC PANEL  CBC  PROTIME-INR  POC OCCULT BLOOD, ED  POC OCCULT BLOOD, ED  TYPE AND SCREEN  ABO/RH  PREPARE RBC (CROSSMATCH)  PREPARE RBC (CROSSMATCH)    Imaging Review No results found. I have personally reviewed and evaluated these images and lab results as part of my medical decision-making.   EKG Interpretation   Date/Time:  Tuesday April 03 2015 15:12:22 EDT Ventricular Rate:  66 PR Interval:  212 QRS Duration: 88 QT Interval:  567 QTC Calculation: 594 R Axis:   -33 Text Interpretation:  Sinus rhythm Multiform ventricular premature  complexes Borderline prolonged PR interval Inferior infarct, old Prolonged  QT interval No previous ECGs available Confirmed by Manus Gunning  MD, Keerat Denicola  [phone]) on 04/03/2015 3:24:13 PM      MDM   Final diagnoses:  Deep vein thrombosis (DVT) of lower extremity, unspecified chronicity, unspecified laterality, unspecified vein (HCC)  PE (pulmonary embolism)   Patient with asymptomatic anemia on Coumadin for history of DVT. She denies any chest pain. Shortness of breath is slightly worse than usual. She is not wheezing.  Hemoglobin is 6.3 with INR 1.9. Patient will be transfused 2 units of packed red blood cells.  Hemoglobin 6.3 from baseline of 10. Patient is agreeable to blood transfusion. We'll hold Coumadin. Vitals are stable. She has no hypotension or tachycardia. Hemoccult is surprisingly negative. This is still the most likely source.  Coumadin will be held. Patient to be transfused as above. Creatinine is slightly worse than baseline. IV fluids continued. Vitals are maintained stable in the ED.  Admission d/w Dr. Elisabeth Pigeon  CRITICAL CARE Performed by: Glynn Octave Total critical care time: 30 minutes Critical care time was exclusive of separately billable procedures and treating other patients. Critical care was necessary to treat or prevent imminent or life-threatening deterioration. Critical care was time spent personally by me  on the following activities: development of treatment plan with patient and/or surrogate as well as nursing, discussions with consultants, evaluation of patient's response to treatment, examination of patient, obtaining history from patient or surrogate, ordering and performing treatments and interventions, ordering and review of laboratory studies, ordering and review of radiographic studies, pulse oximetry and re-evaluation of patient's condition.   Glynn Octave, MD 04/03/15 [phone]

## 2015-04-03 NOTE — ED Notes (Signed)
[facility] , per staff pt had positive hemoccult, and low hemoglobin of 6. Pt is asymptomatic , alert and oriented x 4. Pt  Ambulates with significant assistance per [facility]. BP 129/50 pulse 62, O2 sat 94 RA. CBG 315

## 2015-04-03 NOTE — ED Notes (Signed)
Bed: [id number] Expected date:  Expected time:  Means of arrival:  Comments: Triage rawls

## 2015-04-03 NOTE — H&P (Addendum)
[facility] History and Physical  Courtney Montgomery ZOX:[id number] DOB: 1927/12/18 DOA: 04/03/2015  Referring physician: ER physician: Dr. Glynn OctaveStephen Rancour  PCP: [name], [name], PA-C  Chief Complaint: low hemoglobin   HPI:  81 year old female with past medical history of dementia, hypertension, CKD stage 3, diabetes mellitus on insulin, DVT/PE on coumadin who os from ALF and [facility] with low hemoglobin and stool positive for occult blood. Patient reports no evidence of bleeding (blood in stool or melena) but due to dementia she is not reliable historian. She says she feels tired and sometime short of breat but no reports of chest pain, lightheadedness or loss of consciousness. No reports of abdominal pain, nausea or vomiting. No fevers. She can't recall if she had colonoscopy or EGD in past.   In ED, pt was hemodynamically stable. Blood work revealed hemoglobin of 6.3, creatinine 1.42 (baseline 2 years ago was 1.26. She was given dose of rocephin in ED for possible UTI but no UA ordered on this admission. The 2 units of PRBC ordered and started on admission. She was admitted for further evaluation and management of symptomatic anemia.   Assessment & Plan    Principal Problem:   Symptomatic anemia / Anemia of chronic kidney failure - Unclear etiology - Apparently FOBT positive in ALF. FOBT in ED was actually negative - She is on coumadin for DVT/PE but her INR is in therapeutic range - Coumadin placed on hold - Start 2 Units of PRBC transfusion - Continue to monitor daily CBC - In EPIC no report of EGD/colonoscopy in past. Would probably just treat conservatively considering her age and comorbidities unless hemoglobin continues to be low then she may need GI consult for further evaluation    Active Problems:   UTI (urinary tract infection) - Large leukocytes seen on admission UA - Started empiric rocephin - Follow up urine culture results    DVT (deep  venous thrombosis) (HCC) / PE (pulmonary embolism) - Coumadin on hold due to anemia - Resume once it is determined it is safe to take it provided stable hemoglobin and no signs of bleeding       Controlled diabetes mellitus with diabetic neuropathy, with long-term current use of insulin (HCC) - Check A1 on this admission - No previous A1c on file - Continue lantus 35 units BID and novolog 20 units TID AC    Essential hypertension - Lasix and lisinopril  on hold due to renal insufficiency - Started Norvasc 5 mg daily    Dyslipidemia associated with type 2 diabetes mellitus (HCC) - Continue simvastatin    Hypothyroidism - Resume synthroid    Acute renal failure superimposed on stage 3 chronic kidney disease (HCC) - Baseline Cr 2 years ago is 1.26 - Cr on this admission is 1.42 - Likely due to lisinopril and lasix - Both medications placed on hold - Will give IV fluids only for next 24 hours - Resume once renal function closer to baseline values    Dementia without behavioral disturbance - Stable - Continue Aricept - PT/OT evaluation     Depression - Stable, not depressed - Continue buspirone and lexapro  DVT prophylaxis:  - Coumadin on hold due to anemia  - Use SCD's instead   Radiological Exams on Admission: No results found.   Code Status: Full Family Communication: Family not at the bedside  Disposition Plan: Admit for further evaluation, telemetry admission   Manson PasseyEVINE, Lorielle Boehning, MD  [facility]  Pager [phone]  Time spent in minutes: 55 minutes  Review of Systems:  Constitutional: Negative for fever, chills and malaise/fatigue. Negative for diaphoresis.  HENT: Negative for hearing loss, ear pain, nosebleeds, congestion, sore throat, neck pain, tinnitus and ear discharge.   Eyes: Negative for blurred vision, double vision, photophobia, pain, discharge and redness.  Respiratory: Negative for cough, hemoptysis, sputum production, shortness of breath, wheezing  and stridor.   Cardiovascular: Negative for chest pain, palpitations, orthopnea, claudication and leg swelling.  Gastrointestinal: Negative for nausea, vomiting and abdominal pain. Negative for heartburn, constipation, blood in stool and melena.  Genitourinary: Negative for dysuria, urgency, frequency, hematuria and flank pain.  Musculoskeletal: Negative for myalgias, back pain, joint pain and falls.  Skin: Negative for itching and rash.  Neurological: Negative for dizziness and weakness. Negative for tingling, tremors, sensory change, speech change, focal weakness, loss of consciousness and headaches.  Endo/Heme/Allergies: Negative for environmental allergies and polydipsia. Does not bruise/bleed easily.  Psychiatric/Behavioral: Negative for suicidal ideas. The patient is not nervous/anxious.      Past Medical History  Diagnosis Date  . Stroke (HCC)   . Neuropathy (HCC)   . Swelling   . General weakness   . CHF (congestive heart failure) (HCC)   . CPAP (continuous positive airway pressure) dependence   . Diabetes mellitus without complication (HCC)   . Allergic rhinitis   . Hyperlipidemia   . Venous insufficiency   . Urinary incontinence   . Anxiety   . Depression   . Osteoarthritis   . COPD (chronic obstructive pulmonary disease) (HCC)   . DVT (deep venous thrombosis) (HCC)   . PE (pulmonary embolism)    Past Surgical History  Procedure Laterality Date  . Cholecystectomy    . Appendectomy    . Cataract extraction, bilateral     Social History:  reports that she has quit smoking. She has never used smokeless tobacco. She reports that she does not drink alcohol or use illicit drugs.  Allergies  Allergen Reactions  . Pollen Extract Other (See Comments)    unknown    Family History: Hypertension in family    Prior to Admission medications   Medication Sig Start Date End Date Taking? Authorizing Provider  acetaminophen (TYLENOL) 500 MG tablet Take 1,000 mg by mouth  every 6 (six) hours as needed for pain or fever.    Historical Provider, MD  albuterol (PROVENTIL) (2.5 MG/3ML) 0.083% nebulizer solution Take 2.5 mg by nebulization every 6 (six) hours as needed for wheezing or shortness of breath.     Historical Provider, MD  amLODipine (NORVASC) 5 MG tablet Take 5 mg by mouth every morning.     Historical Provider, MD  busPIRone (BUSPAR) 15 MG tablet Take 15 mg by mouth 2 (two) times daily.    Historical Provider, MD  cholecalciferol (VITAMIN D) 1000 UNITS tablet Take 1,000 Units by mouth every morning.     Historical Provider, MD  cyclobenzaprine (FLEXERIL) 10 MG tablet Take 10 mg by mouth at bedtime as needed (pain).    Historical Provider, MD  docusate sodium (COLACE) 100 MG capsule Take 100 mg by mouth every morning.     Historical Provider, MD  donepezil (ARICEPT) 10 MG tablet Take 1 tablet (10 mg total) by mouth at bedtime. 06/07/13   Rebecca S Tat, DO  donepezil (ARICEPT) 5 MG tablet Take 1 tablet (5 mg total) by mouth at bedtime. 06/07/13   Rebecca S Tat, DO  escitalopram (LEXAPRO) 10 MG tablet Take  10 mg by mouth every morning.     Historical Provider, MD  fexofenadine (ALLEGRA) 180 MG tablet Take 180 mg by mouth daily as needed (allergies).     Historical Provider, MD  Fluticasone-Salmeterol (ADVAIR) 250-50 MCG/DOSE AEPB Inhale 1 puff into the lungs 2 (two) times daily. Rinse and spit after each use.    Historical Provider, MD  furosemide (LASIX) 20 MG tablet Take 20 mg by mouth every morning.     Historical Provider, MD  guaiFENesin (MUCINEX) 600 MG 12 hr tablet Take 1,200 mg by mouth 2 (two) times daily as needed for congestion.     Historical Provider, MD  guaifenesin (ROBITUSSIN) 100 MG/5ML syrup Take 100 mg by mouth every 4 (four) hours as needed for cough.    Historical Provider, MD  insulin glargine (LANTUS) 100 UNIT/ML injection Inject 35 Units into the skin 2 (two) times daily.     Historical Provider, MD  insulin lispro (HUMALOG) 100 UNIT/ML  injection Inject 0-110 Units into the skin 2 (two) times daily. Sliding Scale Insulin, 6:30 am and 8:30 pm    Historical Provider, MD  insulin lispro (HUMALOG) 100 UNIT/ML injection Inject 20 Units into the skin 3 (three) times daily. 7:30 am, 11:30 am, 4:30 pm    Historical Provider, MD  levothyroxine (SYNTHROID, LEVOTHROID) 25 MCG tablet Take 25 mcg by mouth every morning.     Historical Provider, MD  omeprazole (PRILOSEC) 20 MG capsule Take 20 mg by mouth at bedtime.    Historical Provider, MD  oxybutynin (DITROPAN) 5 MG tablet Take 10 mg by mouth at bedtime.    Historical Provider, MD  oxyCODONE-acetaminophen (PERCOCET/ROXICET) 5-325 MG per tablet Take by mouth every 4 (four) hours as needed for severe pain.    Historical Provider, MD  simvastatin (ZOCOR) 10 MG tablet Take 10 mg by mouth every evening.    Historical Provider, MD  sucralfate (CARAFATE) 1 G tablet Take 1 g by mouth at bedtime.    Historical Provider, MD  traMADol (ULTRAM) 50 MG tablet Take 50 mg by mouth every 4 (four) hours as needed for pain.     Historical Provider, MD  triamcinolone (NASACORT) 55 MCG/ACT nasal inhaler Place 2 sprays into the nose at bedtime.     Historical Provider, MD  trimethoprim (TRIMPEX) 100 MG tablet Take 100 mg by mouth daily.    Historical Provider, MD  warfarin (COUMADIN) 1 MG tablet Take 0.5 mg by mouth daily. Take half tablet daily on Sunday Tuesday Thursday Saturday along with 3 mg    Historical Provider, MD  warfarin (COUMADIN) 3 MG tablet Take 3 mg by mouth every evening. Take at 4pm.    Historical Provider, MD   Physical Exam: Filed Vitals:   04/03/15 1432 04/03/15 1434 04/03/15 1534 04/03/15 1600  BP:  134/40 154/51 149/57  Pulse:  66 67 68  Temp:   98 F (36.7 C)   TempSrc:   Oral   Resp:  16 16 22   Height: 5\' 6"  (1.676 m)     Weight: 113.399 kg (250 lb)     SpO2:  95% 98% 92%    Physical Exam  Constitutional: Appears well-developed and well-nourished. No distress.  HENT:  Normocephalic. No tonsillar erythema or exudates Eyes: Conjunctivae are normal. No scleral icterus.  Neck: Normal ROM. Neck supple. No JVD. No tracheal deviation. No thyromegaly.  CVS: RRR, S1/S2 appreciated   Pulmonary: Effort and breath sounds normal, no stridor, rhonchi, wheezes, rales.  Abdominal: Soft. BS +,  no distension, tenderness, rebound or guarding.  Musculoskeletal: Normal range of motion. No edema and no tenderness.  Lymphadenopathy: No lymphadenopathy noted, cervical, inguinal. Neuro: Alert. Normal reflexes, muscle tone coordination. No focal neurologic deficits. Skin: Skin is warm and dry. No rash noted.  No erythema. No pallor.  Psychiatric: Normal mood and affect. Behavior, judgment, thought content normal.   Labs on Admission:  Basic Metabolic Panel:  Recent Labs Lab 04/03/15 1435  NA 134*  K 4.0  CL 102  CO2 25  GLUCOSE 263*  BUN 21*  CREATININE 1.42*  CALCIUM 9.2   Liver Function Tests:  Recent Labs Lab 04/03/15 1435  AST 11*  ALT 11*  ALKPHOS 50  BILITOT 0.2*  PROT 7.0  ALBUMIN 3.6   No results for input(s): LIPASE, AMYLASE in the last 168 hours. No results for input(s): AMMONIA in the last 168 hours. CBC:  Recent Labs Lab 04/03/15 1435  WBC 9.6  HGB 6.3*  HCT 22.1*  MCV 76.2*  PLT 287   Cardiac Enzymes:  Recent Labs Lab 04/03/15 1513  TROPONINI <0.03   BNP: Invalid input(s): POCBNP CBG: No results for input(s): GLUCAP in the last 168 hours.  If 7PM-7AM, please contact night-coverage [email or web address] Password [email or web address] 04/03/2015, 5:20 PM

## 2015-04-03 NOTE — Progress Notes (Signed)
Utilization Review completed.  Leshaun Biebel RN CM  

## 2015-04-04 DIAGNOSIS — N179 Acute kidney failure, unspecified: Secondary | ICD-10-CM

## 2015-04-04 DIAGNOSIS — D649 Anemia, unspecified: Secondary | ICD-10-CM

## 2015-04-04 DIAGNOSIS — N183 Chronic kidney disease, stage 3 (moderate): Secondary | ICD-10-CM

## 2015-04-04 LAB — CBC
HEMATOCRIT: 26.7 % — AB (ref 36.0–46.0)
HEMOGLOBIN: 7.8 g/dL — AB (ref 12.0–15.0)
MCH: 22.7 pg — ABNORMAL LOW (ref 26.0–34.0)
MCHC: 29.2 g/dL — ABNORMAL LOW (ref 30.0–36.0)
MCV: 77.6 fL — AB (ref 78.0–100.0)
PLATELETS: 244 10*3/uL (ref 150–400)
RBC: 3.44 MIL/uL — AB (ref 3.87–5.11)
RDW: 17.9 % — ABNORMAL HIGH (ref 11.5–15.5)
WBC: 9.9 10*3/uL (ref 4.0–10.5)

## 2015-04-04 LAB — COMPREHENSIVE METABOLIC PANEL
ALT: 10 U/L — ABNORMAL LOW (ref 14–54)
ANION GAP: 6 (ref 5–15)
AST: 12 U/L — ABNORMAL LOW (ref 15–41)
Albumin: 3.4 g/dL — ABNORMAL LOW (ref 3.5–5.0)
Alkaline Phosphatase: 46 U/L (ref 38–126)
BUN: 19 mg/dL (ref 6–20)
CHLORIDE: 104 mmol/L (ref 101–111)
CO2: 26 mmol/L (ref 22–32)
CREATININE: 1.22 mg/dL — AB (ref 0.44–1.00)
Calcium: 9.2 mg/dL (ref 8.9–10.3)
GFR, EST AFRICAN AMERICAN: 45 mL/min — AB (ref >60)
GFR, EST NON AFRICAN AMERICAN: 39 mL/min — AB (ref >60)
Glucose, Bld: 200 mg/dL — ABNORMAL HIGH (ref 65–99)
POTASSIUM: 4 mmol/L (ref 3.5–5.1)
SODIUM: 136 mmol/L (ref 135–145)
Total Bilirubin: 0.4 mg/dL (ref 0.3–1.2)
Total Protein: 6.8 g/dL (ref 6.5–8.1)

## 2015-04-04 LAB — GLUCOSE, CAPILLARY
GLUCOSE-CAPILLARY: 175 mg/dL — AB (ref 65–99)
GLUCOSE-CAPILLARY: 189 mg/dL — AB (ref 65–99)
GLUCOSE-CAPILLARY: 61 mg/dL — AB (ref 65–99)
Glucose-Capillary: 161 mg/dL — ABNORMAL HIGH (ref 65–99)
Glucose-Capillary: 101 mg/dL — ABNORMAL HIGH (ref 65–99)
Glucose-Capillary: 57 mg/dL — ABNORMAL LOW (ref 65–99)

## 2015-04-04 LAB — TYPE AND SCREEN
ABO/RH(D): A POS
Antibody Screen: NEGATIVE
Unit division: 0
Unit division: 0

## 2015-04-04 LAB — MAGNESIUM: MAGNESIUM: 1.8 mg/dL (ref 1.7–2.4)

## 2015-04-04 LAB — PROTIME-INR
INR: 1.43 (ref 0.00–1.49)
Prothrombin Time: 17.5 seconds — ABNORMAL HIGH (ref 11.6–15.2)

## 2015-04-04 LAB — DIFFERENTIAL
BASOS PCT: 0 %
Basophils Absolute: 0 10*3/uL (ref 0.0–0.1)
Eosinophils Absolute: 0.2 10*3/uL (ref 0.0–0.7)
Eosinophils Relative: 2 %
LYMPHS PCT: 18 %
Lymphs Abs: 1.8 10*3/uL (ref 0.7–4.0)
MONO ABS: 0.7 10*3/uL (ref 0.1–1.0)
Monocytes Relative: 7 %
NEUTROS ABS: 7.2 10*3/uL (ref 1.7–7.7)
NEUTROS PCT: 73 %

## 2015-04-04 LAB — PHOSPHORUS: PHOSPHORUS: 3.6 mg/dL (ref 2.5–4.6)

## 2015-04-04 LAB — TSH: TSH: 1.675 u[IU]/mL (ref 0.350–4.500)

## 2015-04-04 MED ORDER — PEG 3350-KCL-NA BICARB-NACL 420 G PO SOLR
4000.0000 mL | Freq: Once | ORAL | Status: AC
  Administered 2015-04-04: 4000 mL via ORAL

## 2015-04-04 NOTE — Evaluation (Signed)
Physical Therapy Evaluation Patient Details Name: Courtney Montgomery MRN: [id number] DOB: [birth year] Today's Date: 04/04/2015   History of Present Illness  Pt is an 81 y/o female with h/o dementia admitted from ALF secondary to symptomatic anemia.   Clinical Impression  Pt admitted as above and presenting with functional mobility limitations 2* obesity, deconditioning, balance deficits, and generalized weakness.  Pt hopes to dc back to prior ALF.    Follow Up Recommendations SNF    Equipment Recommendations  None recommended by PT    Recommendations for Other Services OT consult     Precautions / Restrictions Precautions Precautions: Fall Restrictions Weight Bearing Restrictions: No      Mobility  Bed Mobility Overal bed mobility: Needs Assistance;+2 for physical assistance Bed Mobility: Supine to Sit     Supine to sit: +2 for physical assistance;Max assist     General bed mobility comments: Pt with posterior lean noted in sitting as well as left lateral lean which she was able to correct given moderate verbal and tactile cues repeatedly over time. "I don't have any balance"  Transfers Overall transfer level: Needs assistance Equipment used: Rolling walker (2 wheeled) Transfers: Sit to/from Stand Sit to Stand: +2 physical assistance;+2 safety/equipment;From elevated surface;Min assist;Mod assist Stand pivot transfers: +2 physical assistance;+2 safety/equipment;From elevated surface;Max assist       General transfer comment: Pt unable to advance RLE for SPT to right despite (mod-max assist) and better able to transfer to left given +2 max physical assist and bringing chair up behind her on the left.  Ambulation/Gait             General Gait Details: Pt unable to WB sufficiently on L LE and UEs to advance R LE  Stairs            Wheelchair Mobility    Modified Rankin (Stroke Patients Only)       Balance Overall balance assessment: Needs  assistance Sitting-balance support: Bilateral upper extremity supported;Feet supported Sitting balance-Leahy Scale: Poor   Postural control: Posterior lean;Left lateral lean Standing balance support: Bilateral upper extremity supported Standing balance-Leahy Scale: Poor                               Pertinent Vitals/Pain Pain Assessment: No/denies pain    Home Living Family/patient expects to be discharged to:: Skilled nursing facility                 Additional Comments: [facility]    Prior Function Level of Independence: Needs assistance   Gait / Transfers Assistance Needed: Pt states that she does not ambulate except to transfer from bed to chair "For a long while now" given assistance  ADL's / Homemaking Assistance Needed: Pt requires assistance for all ADL's - max assist per her report        Hand Dominance   Dominant Hand: Right    Extremity/Trunk Assessment   Upper Extremity Assessment: Generalized weakness RUE Deficits / Details: Pt with limited shoulder flexion bilateral UE's; decreased AROM overall     LUE Deficits / Details: Pt with limited shoulder flexion bilateral UE's; decreased AROM overall   Lower Extremity Assessment: RLE deficits/detail;LLE deficits/detail;Generalized weakness RLE Deficits / Details: 4-/5 quads LLE Deficits / Details: 3-/5 quads  Cervical / Trunk Assessment: Kyphotic  Communication   Communication: No difficulties  Cognition Arousal/Alertness: Awake/alert Behavior During Therapy: WFL for tasks assessed/performed Overall Cognitive Status: History of cognitive  impairments - at baseline                      General Comments      Exercises General Exercises - Lower Extremity Ankle Circles/Pumps: AROM;Both;15 reps;Supine      Assessment/Plan    PT Assessment Patient needs continued PT services  PT Diagnosis Difficulty walking   PT Problem List Decreased strength;Decreased range of  motion;Decreased activity tolerance;Decreased mobility;Decreased knowledge of use of DME;Decreased balance;Obesity  PT Treatment Interventions DME instruction;Functional mobility training;Therapeutic activities;Therapeutic exercise;Balance training;Patient/family education   PT Goals (Current goals can be found in the Care Plan section) Acute Rehab PT Goals Patient Stated Goal: Pt states that she plans to return to SNF/ALF PT Goal Formulation: With patient Time For Goal Achievement: 04/11/15 Potential to Achieve Goals: Good    Frequency Min 3X/week   Barriers to discharge        Co-evaluation PT/OT/SLP Co-Evaluation/Treatment: Yes Reason for Co-Treatment: For patient/therapist safety PT goals addressed during session: Mobility/safety with mobility OT goals addressed during session: ADL's and self-care       End of Session Equipment Utilized During Treatment: Gait belt Activity Tolerance: Patient tolerated treatment well;Patient limited by fatigue Patient left: in chair;with call bell/phone within reach;with chair alarm set Nurse Communication: Mobility status         Time: 1122-1150 PT Time Calculation (min) (ACUTE ONLY): 28 min   Charges:   PT Evaluation $Initial PT Evaluation Tier I: 1 Procedure     PT G Codes:        Courtney Montgomery 04/27/2015, 12:45 PM

## 2015-04-04 NOTE — NC FL2 (Deleted)
[place] MEDICAID FL2 LEVEL OF CARE SCREENING TOOL     IDENTIFICATION  Patient Name: Courtney Montgomery Birthdate: Aug 20, 1927 Sex: female Admission Date (Current Location): 04/03/2015  [facility] and [place] Number: Dispensing optician and Address:  [facility],  [address] [place], [place] [address]      Provider Number: [id number]  Attending Physician Name and Address:  Rodolph Bong, MD  Relative Name and Phone Number:       Current Level of Care: Hospital Recommended Level of Care: Assisted Living Facility Prior Approval Number:    Date Approved/Denied:   PASRR Number: [id number]  Discharge Plan: Other (Comment) (Assisted Living )    Current Diagnoses: Patient Active Problem List   Diagnosis Date Noted  . UTI (urinary tract infection) 04/03/2015  . Dementia without behavioral disturbance 04/03/2015  . Depression 04/03/2015  . Dyslipidemia associated with type 2 diabetes mellitus (HCC) 04/03/2015  . Controlled diabetes mellitus with diabetic neuropathy, with long-term current use of insulin (HCC) 04/03/2015  . Essential hypertension 04/03/2015  . Symptomatic anemia 04/03/2015  . Hypothyroidism 04/03/2015  . Acute renal failure superimposed on stage 3 chronic kidney disease (HCC) 04/03/2015  . Anemia of chronic kidney failure 04/03/2015  . PE (pulmonary embolism)   . Deep vein thrombosis (DVT) of lower extremity (HCC)     Orientation ACTIVITIES/SOCIAL BLADDER RESPIRATION    Self, Time, Place  Active Incontinent Normal  BEHAVIORAL SYMPTOMS/MOOD NEUROLOGICAL BOWEL NUTRITION STATUS      Continent Diet (carb modified )  PHYSICIAN VISITS COMMUNICATION OF NEEDS Height & Weight Skin    Verbally 5' 6.5" (168.9 cm) 254 lbs. Normal          AMBULATORY STATUS RESPIRATION    Supervision limited Normal      Personal Care Assistance Level of Assistance  Bathing, Dressing Bathing Assistance: Limited assistance   Dressing Assistance: Limited assistance       Functional Limitations Info    Sight Info: Adequate           SPECIAL CARE FACTORS FREQUENCY  PT (By licensed PT), OT (By licensed OT)                   Additional Factors Info  Allergies, Code Status, Psychotropic, Insulin Sliding Scale Code Status Info: Full Allergies Info: Pollen Extract Psychotropic Info: Lexapro Insulin Sliding Scale Info: 0-15 units: 3 times a day with meals        Current Medications (04/04/2015): Current Facility-Administered Medications  Medication Dose Route Frequency Provider Last Rate Last Dose  . 0.9 %  sodium chloride infusion   Intravenous Continuous Rodolph Bong, MD 50 mL/hr at 04/04/15 [phone]    . acetaminophen (TYLENOL) tablet 1,000 mg  1,000 mg Oral Q6H PRN Alison Murray, MD      . albuterol (PROVENTIL) (2.5 MG/3ML) 0.083% nebulizer solution 2.5 mg  2.5 mg Nebulization Q6H PRN Alison Murray, MD      . amLODipine (NORVASC) tablet 5 mg  5 mg Oral q morning - 10a Alison Murray, MD   5 mg at 04/03/15 2259  . busPIRone (BUSPAR) tablet 15 mg  15 mg Oral BID Alison Murray, MD   15 mg at 04/04/15 1059  . cholecalciferol (VITAMIN D) tablet 1,000 Units  1,000 Units Oral q morning - 10a Alison Murray, MD   1,000 Units at 04/04/15 1059  . cyclobenzaprine (FLEXERIL) tablet 10 mg  10 mg Oral QHS PRN Alison Murray, MD      .  docusate sodium (COLACE) capsule 100 mg  100 mg Oral q morning - 10a Alison Murray, MD   100 mg at 04/04/15 1059  . donepezil (ARICEPT) tablet 5 mg  5 mg Oral QHS Alison Murray, MD   5 mg at 04/03/15 2258  . escitalopram (LEXAPRO) tablet 20 mg  20 mg Oral q morning - 10a Leda Gauze, NP   20 mg at 04/04/15 1059  . fluticasone (FLONASE) 50 MCG/ACT nasal spray 1 spray  1 spray Each Nare Daily Alison Murray, MD   1 spray at 04/04/15 1059  . guaifenesin (ROBITUSSIN) 100 MG/5ML syrup 100 mg  100 mg Oral Q4H PRN Alison Murray, MD      . insulin aspart (novoLOG) injection 0-15 Units  0-15 Units Subcutaneous TID [facility] Alison Murray, MD   3 Units at 04/04/15 0900  . insulin aspart (novoLOG) injection 20 Units  20 Units Subcutaneous TID WC Alison Murray, MD   20 Units at 04/04/15 0900  . insulin glargine (LANTUS) injection 28 Units  28 Units Subcutaneous QHS Leda Gauze, NP   28 Units at 04/03/15 2301  . insulin glargine (LANTUS) injection 30 Units  30 Units Subcutaneous Daily Leda Gauze, NP   30 Units at 04/04/15 1059  . levothyroxine (SYNTHROID, LEVOTHROID) tablet 25 mcg  25 mcg Oral QAC breakfast Alison Murray, MD   25 mcg at 04/04/15 0900  . loratadine (CLARITIN) tablet 10 mg  10 mg Oral Daily Alison Murray, MD   10 mg at 04/04/15 1059  . mometasone-formoterol (DULERA) 100-5 MCG/ACT inhaler 2 puff  2 puff Inhalation BID Alison Murray, MD   2 puff at 04/04/15 [phone]  . ondansetron (ZOFRAN) tablet 4 mg  4 mg Oral Q6H PRN Alison Murray, MD       Or  . ondansetron [facility]) injection 4 mg  4 mg Intravenous Q6H PRN Alison Murray, MD      . oxybutynin (DITROPAN) tablet 10 mg  10 mg Oral QHS Alison Murray, MD   10 mg at 04/03/15 2257  . oxyCODONE-acetaminophen (PERCOCET/ROXICET) 5-325 MG per tablet 1 tablet  1 tablet Oral Q4H PRN Alison Murray, MD      . pantoprazole (PROTONIX) EC tablet 40 mg  40 mg Oral BID Alison Murray, MD   40 mg at 04/04/15 1059  . simvastatin (ZOCOR) tablet 5 mg  5 mg Oral QPM Alison Murray, MD   5 mg at 04/03/15 2259  . sodium chloride 0.9 % injection 3 mL  3 mL Intravenous Q12H Alison Murray, MD   3 mL at 04/03/15 2303  . sucralfate (CARAFATE) tablet 1 g  1 g Oral QHS Alison Murray, MD   1 g at 04/03/15 2259   Do not use this list as official medication orders. Please verify with discharge summary.  Discharge Medications:   Medication List    ASK your doctor about these medications        acetaminophen 325 MG tablet  Commonly known as:  TYLENOL  Take 650 mg by mouth 3 (three) times daily.     albuterol 108 (90 BASE) MCG/ACT inhaler  Commonly known as:  PROVENTIL  HFA;VENTOLIN HFA  Inhale 2 puffs into the lungs every 6 (six) hours as needed for wheezing or shortness of breath.     cholecalciferol 1000 UNITS tablet  Commonly known as:  VITAMIN D  Take 1,000 Units by  mouth every morning.     Cranberry 450 MG Tabs  Take 1 tablet by mouth 2 (two) times daily.     diclofenac sodium 1 % Gel  Commonly known as:  VOLTAREN  Apply 2 g topically 2 (two) times daily.     donepezil 5 MG tablet  Commonly known as:  ARICEPT  Take 1 tablet (5 mg total) by mouth at bedtime.     escitalopram 20 MG tablet  Commonly known as:  LEXAPRO  Take 20 mg by mouth daily with breakfast.     fexofenadine 60 MG tablet  Commonly known as:  ALLEGRA  Take 60 mg by mouth daily with breakfast.     fluticasone 50 MCG/ACT nasal spray  Commonly known as:  FLONASE  Place 1 spray into both nostrils at bedtime.     Fluticasone-Salmeterol 250-50 MCG/DOSE Aepb  Commonly known as:  ADVAIR  Inhale 1 puff into the lungs 2 (two) times daily. Rinse and spit after each use.     furosemide 40 MG tablet  Commonly known as:  LASIX  Take 40 mg by mouth daily with breakfast.     HUMALOG KWIKPEN 100 UNIT/ML KiwkPen  Generic drug:  insulin lispro  Inject 20 Units into the skin 3 (three) times daily.     insulin glargine 100 UNIT/ML injection  Commonly known as:  LANTUS  Inject 28-30 Units into the skin 2 (two) times daily. Takes 30 units in the morning and 28 units at bedtime     levothyroxine 25 MCG tablet  Commonly known as:  SYNTHROID, LEVOTHROID  Take 25 mcg by mouth every evening.     lisinopril 5 MG tablet  Commonly known as:  PRINIVIL,ZESTRIL  Take 5 mg by mouth daily with breakfast.     nystatin 100000 UNIT/GM Powd  Apply 1 g topically daily with breakfast. Apply under breasts     omeprazole 40 MG capsule  Commonly known as:  PRILOSEC  Take 40 mg by mouth 2 (two) times daily.     simvastatin 5 MG tablet  Commonly known as:  ZOCOR  Take 5 mg by mouth at bedtime.      sucralfate 1 G tablet  Commonly known as:  CARAFATE  Take 1 g by mouth at bedtime.     tiotropium 18 MCG inhalation capsule  Commonly known as:  SPIRIVA  Place 18 mcg into inhaler and inhale at bedtime.     traMADol 50 MG tablet  Commonly known as:  ULTRAM  Take 50 mg by mouth 2 (two) times daily as needed (for pain).        Relevant Imaging Results:  Relevant Lab Results:  Recent Labs    Additional Information    Loleta DickerJoyce S Dantavious Snowball, LCSW

## 2015-04-04 NOTE — Progress Notes (Signed)
[facility] PROGRESS NOTE  Courtney Montgomery AOZ:[id number] DOB: 06-06-1927 DOA: 04/03/2015 PCP: [name], [name], PA-C  Assessment/Plan: #1 symptomatic anemia Questionable etiology. Patient with no overt bleeding. Patient status post 2 units packed red blood cells. Hemoglobin currently of 7.8 from 6.3 on admission. Continue Protonix and Carafate. Patient has been seen in consultation by gastroenterology and plan is for upper endoscopy and colonoscopy tomorrow. Follow H&H.  #2 hypothyroidism TSH = 1.675. Continue Synthroid.  #3 diabetes mellitus Hemoglobin A1c pending. Continue current dose of Lantus and sliding scale insulin.  #4 hypertension Continue Norvasc. Lasix and lisinopril on hold secondary to worsening renal function.  #5 hyperlipidemia Continue statin.  #6 history of DVT Anticoagulation on hold secondary to problem #1. After GI workup GI to advise when anticoagulation can be resumed.  #7 acute renal failure superimposed on chronic kidney disease stage III Baseline creatinine 2 years ago was 1.26. Likely secondary to diuretics and nephrotoxins. ACE inhibitor and diuretics on hold. Renal function improving. Continue gentle hydration. Follow.  #8 dementia Stable.  #9 prophylaxis PPI for GI prophylaxis. SCDs for DVT prophylaxis.  Code Status: Full Family Communication: Updated patient. No family present. Disposition Plan: When medically stable and hemoglobin has stabilized and post GI evaluation.   Consultants:  Gastroenterology: Dr. Evette CristalGanem 04/04/2015  Procedures: 2 units packed red blood cells 04/03/2015  Antibiotics:  IV Rocephin 04/03/2015>>..04/03/2015  HPI/Subjective: Sitting up in chair. No chest pain. No shortness of breath. Feels better. Very forgetful. Patient had to be reminded that she was seen by the gastroenterologist a few hours before.  Objective: Filed Vitals:   04/04/15 0643  BP: 155/52  Pulse: 81  Temp: 98.6 F (37 C)  Resp: 18     Intake/Output Summary (Last 24 hours) at 04/04/15 1450 Last data filed at 04/04/15 0900  Gross per 24 hour  Intake 2113.75 ml  Output      0 ml  Net 2113.75 ml   Filed Weights   04/03/15 1432 04/03/15 1850  Weight: 113.399 kg (250 lb) 115.6 kg (254 lb 13.6 oz)    Exam:   General:  NAD  Cardiovascular: RRR  Respiratory: CTAB  Abdomen: Soft, nontender, nondistended, positive bowel sounds.  Musculoskeletal: No clubbing cyanosis or edema.  Data Reviewed: Basic Metabolic Panel:  Recent Labs Lab 04/03/15 1435 04/04/15 0530  NA 134* 136  K 4.0 4.0  CL 102 104  CO2 25 26  GLUCOSE 263* 200*  BUN 21* 19  CREATININE 1.42* 1.22*  CALCIUM 9.2 9.2  MG  --  1.8  PHOS  --  3.6   Liver Function Tests:  Recent Labs Lab 04/03/15 1435 04/04/15 0530  AST 11* 12*  ALT 11* 10*  ALKPHOS 50 46  BILITOT 0.2* 0.4  PROT 7.0 6.8  ALBUMIN 3.6 3.4*   No results for input(s): LIPASE, AMYLASE in the last 168 hours. No results for input(s): AMMONIA in the last 168 hours. CBC:  Recent Labs Lab 04/03/15 1435 04/03/15 1900 04/04/15 0530  WBC 9.6 DUPLICATE REQUEST  RN NAKIA SEE ACC T9368 9.9  NEUTROABS  --  PENDING 7.2  HGB 6.3* DUPLICATE REQUEST  RN NAKIA SEE ACC T9368 7.8*  HCT 22.1* DUPLICATE REQUEST  RN NAKIA SEE ACC T9368 26.7*  MCV 76.2* DUPLICATE REQUEST  RN NAKIA SEE ACC T9368 77.6*  PLT 287 DUPLICATE REQUEST  RN NAKIA SEE ACC [id number]   Cardiac Enzymes:  Recent Labs Lab 04/03/15 1513  TROPONINI <0.03   BNP (last 3 results) No  results for input(s): BNP in the last 8760 hours.  ProBNP (last 3 results) No results for input(s): PROBNP in the last 8760 hours.  CBG:  Recent Labs Lab 04/03/15 2205 04/04/15 0719 04/04/15 1217  GLUCAP 214* 175* 189*    No results found for this or any previous visit (from the past 240 hour(s)).   Studies: No results found.  Scheduled Meds: . amLODipine  5 mg Oral q morning - 10a  . busPIRone  15 mg Oral BID  .  cholecalciferol  1,000 Units Oral q morning - 10a  . docusate sodium  100 mg Oral q morning - 10a  . donepezil  5 mg Oral QHS  . escitalopram  20 mg Oral q morning - 10a  . fluticasone  1 spray Each Nare Daily  . insulin aspart  0-15 Units Subcutaneous TID WC  . insulin aspart  20 Units Subcutaneous TID WC  . insulin glargine  28 Units Subcutaneous QHS  . insulin glargine  30 Units Subcutaneous Daily  . levothyroxine  25 mcg Oral QAC breakfast  . loratadine  10 mg Oral Daily  . mometasone-formoterol  2 puff Inhalation BID  . oxybutynin  10 mg Oral QHS  . pantoprazole  40 mg Oral BID  . polyethylene glycol-electrolytes  4,000 mL Oral Once  . simvastatin  5 mg Oral QPM  . sodium chloride  3 mL Intravenous Q12H  . sucralfate  1 g Oral QHS   Continuous Infusions: . sodium chloride 50 mL/hr at 04/04/15 1359    Principal Problem:   Symptomatic anemia Active Problems:   UTI (urinary tract infection)   PE (pulmonary embolism)   Dementia without behavioral disturbance   Depression   Dyslipidemia associated with type 2 diabetes mellitus (HCC)   Controlled diabetes mellitus with diabetic neuropathy, with long-term current use of insulin (HCC)   Essential hypertension   Hypothyroidism   Acute renal failure superimposed on stage 3 chronic kidney disease (HCC)   Anemia of chronic kidney failure   Deep vein thrombosis (DVT) of lower extremity (HCC)    Time spent: 35 minutes    THOMPSON,DANIEL M.D. [facility] Pager [phone]. If 7PM-7AM, please contact night-coverage at [email or web address], password [email or web address] 04/04/2015, 2:50 PM  LOS: 1 day

## 2015-04-04 NOTE — Clinical Documentation Improvement (Signed)
Hospitalist  Can the diagnosis of CHF be further specified?    Acuity - Acute, Chronic, Acute on Chronic   Type - Systolic, Diastolic, Systolic and Diastolic  Other  Clinically Undetermined   Document any associated diagnoses/conditions Please update your documentation within the medical record to reflect your response to this query. Thank you.  Supporting Information: ( As per notes) CHF (congestive heart failure) (HCC)    Please exercise your independent, professional judgment when responding. A specific answer is not anticipated or expected.  Thank You, Shakima Nisley B Osa Campoli, RN, BSN, CCDS,Clinical Documentation Specialist:  [phone]  [phone]=Cell [place]- Health Information Management  

## 2015-04-04 NOTE — Evaluation (Signed)
Occupational Therapy Evaluation Patient Details Name: Courtney Montgomery MRN: [id number] DOB: [birth year] Today's Date: 04/04/2015    History of Present Illness Pt is an 81 y/o female with h/o dementia admitted from ALF secondary to symptomatic anemia.    Clinical Impression   Pt presents from SNF/ALF and states that she does not ambulate except to ocassionally transfer from bed to chair given +2 assist & RW; pt was educated verbally in Role of OT and states that staff "changes her and dresses her PRN" she does not use a toilet per her report. She may benefit from acute OT to assist in decreaseing burden of care in preparation for increased sitting balance for ADL's and self care tasks. Pt plans to d/c back to SNF/ALF.    Follow Up Recommendations  SNF;Supervision/Assistance - 24 hour    Equipment Recommendations  None recommended by OT    Recommendations for Other Services       Precautions / Restrictions Precautions Precautions: Fall Restrictions Weight Bearing Restrictions: No      Mobility Bed Mobility Overal bed mobility: Needs Assistance;+2 for physical assistance Bed Mobility: Supine to Sit     Supine to sit: +2 for physical assistance;Max assist     General bed mobility comments: Pt with posterior lean noted in sitting as well as left lateral lean which she was able to correct given moderate verbal and tactile cues repeatedly over time. "I don't have any balance"  Transfers Overall transfer level: Needs assistance Equipment used: Rolling walker (2 wheeled) Transfers: Sit to/from [employer]'sStand;Stand Pivot Transfers Sit to Stand: +2 physical assistance;+2 safety/equipment;From elevated surface;Min assist;Mod assist Stand pivot transfers: +2 physical assistance;+2 safety/equipment;From elevated surface;Max assist       General transfer comment: Pt unable to advance RLE for SPT to right despite (mod-max assist) and better able to transfer to left given +2 max physical assist  and bringing chair up behind her on the left.    Balance Overall balance assessment: Needs assistance Sitting-balance support: Bilateral upper extremity supported;Feet supported Sitting balance-Leahy Scale: Poor   Postural control: Posterior lean;Left lateral lean Standing balance support: Bilateral upper extremity supported Standing balance-Leahy Scale: Poor                              ADL Overall ADL's : Needs assistance/impaired Eating/Feeding: Set up   Grooming: Minimal assistance;Bed level   Upper Body Bathing: Minimal assitance;Bed level   Lower Body Bathing: Total assistance;Bed level   Upper Body Dressing : Moderate assistance;Sitting Upper Body Dressing Details (indicate cue type and reason): Pt with impaired balance and left lateral lean Lower Body Dressing: Total assistance;Bed level   Toilet Transfer: Total assistance (Pt states that she "does not use the toilet" she uses adult diapers and pads and staff at SNF/ALF "changes" pt PRN)   Toileting- Clothing Manipulation and Hygiene: Total assistance;Bed level       Functional mobility during ADLs: +2 for physical assistance;Rolling walker;+2 for safety/equipment;Maximal assistance General ADL Comments: Pt presents from SNF/ALF and states that she does not ambulate except to ocassionally transfer from bed to chair given +2 assist & RW; pt was educated verbally in Role of OT and states that staff "changes her and dresses her PRN" she does not use a toilet per her report. She may benefit from acute OT to assist in decreaseing burden of caregiver.     Vision  No change from baseline   Perception  Praxis      Pertinent Vitals/Pain Pain Assessment: No/denies pain     Hand Dominance Right   Extremity/Trunk Assessment Upper Extremity Assessment Upper Extremity Assessment: Generalized weakness;RUE deficits/detail;LUE deficits/detail RUE Deficits / Details: Pt with limited shoulder flexion bilateral  UE's; decreased AROM overall LUE Deficits / Details: Pt with limited shoulder flexion bilateral UE's; decreased AROM overall   Lower Extremity Assessment Lower Extremity Assessment: Defer to PT evaluation       Communication Communication Communication: No difficulties   Cognition Arousal/Alertness: Awake/alert Behavior During Therapy: WFL for tasks assessed/performed Overall Cognitive Status: History of cognitive impairments - at baseline (WFL's for tasks assessed)                     General Comments       Exercises       Shoulder Instructions      Home Living Family/patient expects to be discharged to:: Skilled nursing facility                                        Prior Functioning/Environment Level of Independence: Needs assistance  Gait / Transfers Assistance Needed: Pt states that she does not ambulate except to transfer from bed to chair "For a long while now" given assistance ADL's / Homemaking Assistance Needed: Pt requires assistance for all ADL's - max assist per her report        OT Diagnosis: Generalized weakness   OT Problem List: Decreased activity tolerance;Impaired balance (sitting and/or standing);Impaired UE functional use;Decreased knowledge of use of DME or AE   OT Treatment/Interventions: Self-care/ADL training;Therapeutic activities;Patient/family education;Balance training    OT Goals(Current goals can be found in the care plan section) Acute Rehab OT Goals Patient Stated Goal: Pt states that she plans to return to SNF/ALF Time For Goal Achievement: 04/18/15 Potential to Achieve Goals: Good  OT Frequency: Min 2X/week   Barriers to D/C:            Co-evaluation PT/OT/SLP Co-Evaluation/Treatment: Yes Reason for Co-Treatment: For patient/therapist safety;Complexity of the patient's impairments (multi-system involvement)   OT goals addressed during session: ADL's and self-care;Proper use of Adaptive equipment and  DME      End of Session Equipment Utilized During Treatment: Rolling walker;Other (comment) (Pt's shoes/orthotics/lifts)  Activity Tolerance: Patient tolerated treatment well Patient left: in chair;with call bell/phone within reach;with chair alarm set   Time: 1122-1150 OT Time Calculation (min): 28 min Charges:  OT General Charges $OT Visit: 1 Procedure OT Evaluation $Initial OT Evaluation Tier I: 1 Procedure G-Codes:    Alm Bustard, OTR/L 04/04/2015, 12:33 PM

## 2015-04-04 NOTE — NC FL2 (Deleted)
[place] MEDICAID FL2 LEVEL OF CARE SCREENING TOOL     IDENTIFICATION  Patient Name: Courtney Montgomery Birthdate: 06/15/1927 Sex: female Admission Date (Current Location): 04/03/2015  County and Medicaid Number: [place]    Facility and Address:  [facility],  [address], [place] [address]      Provider Number: [id number]  Attending Physician Name and Address:  Daniel Thompson V, MD  Relative Name and Phone Number:       Current Level of Care: Hospital Recommended Level of Care: Assisted Living Facility Prior Approval Number:    Date Approved/Denied:   PASRR Number: [id number]  Discharge Plan: Other (Comment) (Assisted Living )    Current Diagnoses: Patient Active Problem List   Diagnosis Date Noted  . UTI (urinary tract infection) 04/03/2015  . Dementia without behavioral disturbance 04/03/2015  . Depression 04/03/2015  . Dyslipidemia associated with type 2 diabetes mellitus (HCC) 04/03/2015  . Controlled diabetes mellitus with diabetic neuropathy, with long-term current use of insulin (HCC) 04/03/2015  . Essential hypertension 04/03/2015  . Symptomatic anemia 04/03/2015  . Hypothyroidism 04/03/2015  . Acute renal failure superimposed on stage 3 chronic kidney disease (HCC) 04/03/2015  . Anemia of chronic kidney failure 04/03/2015  . PE (pulmonary embolism)   . Deep vein thrombosis (DVT) of lower extremity (HCC)     Orientation ACTIVITIES/SOCIAL BLADDER RESPIRATION    Self, Time, Place  Active Incontinent Normal  BEHAVIORAL SYMPTOMS/MOOD NEUROLOGICAL BOWEL NUTRITION STATUS      Continent Diet (carb modified )  PHYSICIAN VISITS COMMUNICATION OF NEEDS Height & Weight Skin    Verbally 5' 6.5" (168.9 cm) 254 lbs. Normal          AMBULATORY STATUS RESPIRATION    Supervision limited Normal      Personal Care Assistance Level of Assistance  Bathing, Dressing Bathing Assistance: Limited assistance   Dressing Assistance: Limited assistance       Functional Limitations Info    Sight Info: Adequate           SPECIAL CARE FACTORS FREQUENCY  PT (By licensed PT), OT (By licensed OT)                   Additional Factors Info  Allergies, Code Status, Psychotropic, Insulin Sliding Scale Code Status Info: Full Allergies Info: Pollen Extract Psychotropic Info: Lexapro Insulin Sliding Scale Info: 0-15 units: 3 times a day with meals        Current Medications (04/04/2015): Current Facility-Administered Medications  Medication Dose Route Frequency Provider Last Rate Last Dose  . 0.9 %  sodium chloride infusion   Intravenous Continuous Daniel Thompson V, MD 50 mL/hr at 04/04/15 0907    . acetaminophen (TYLENOL) tablet 1,000 mg  1,000 mg Oral Q6H PRN Courtney M Devine, MD      . albuterol (PROVENTIL) (2.5 MG/3ML) 0.083% nebulizer solution 2.5 mg  2.5 mg Nebulization Q6H PRN Courtney M Devine, MD      . amLODipine (NORVASC) tablet 5 mg  5 mg Oral q morning - 10a Courtney M Devine, MD   5 mg at 04/03/15 2259  . busPIRone (BUSPAR) tablet 15 mg  15 mg Oral BID Courtney M Devine, MD   15 mg at 04/04/15 1059  . cholecalciferol (VITAMIN D) tablet 1,000 Units  1,000 Units Oral q morning - 10a Courtney M Devine, MD   1,000 Units at 04/04/15 1059  . cyclobenzaprine (FLEXERIL) tablet 10 mg  10 mg Oral QHS PRN Courtney M Devine, MD      .   docusate sodium (COLACE) capsule 100 mg  100 mg Oral q morning - 10a Alison Murray, MD   100 mg at 04/04/15 1059  . donepezil (ARICEPT) tablet 5 mg  5 mg Oral QHS Alison Murray, MD   5 mg at 04/03/15 2258  . escitalopram (LEXAPRO) tablet 20 mg  20 mg Oral q morning - 10a Leda Gauze, NP   20 mg at 04/04/15 1059  . fluticasone (FLONASE) 50 MCG/ACT nasal spray 1 spray  1 spray Each Nare Daily Alison Murray, MD   1 spray at 04/04/15 1059  . guaifenesin (ROBITUSSIN) 100 MG/5ML syrup 100 mg  100 mg Oral Q4H PRN Alison Murray, MD      . insulin aspart (novoLOG) injection 0-15 Units  0-15 Units Subcutaneous TID [facility] Alison Murray, MD   3 Units at 04/04/15 0900  . insulin aspart (novoLOG) injection 20 Units  20 Units Subcutaneous TID WC Alison Murray, MD   20 Units at 04/04/15 0900  . insulin glargine (LANTUS) injection 28 Units  28 Units Subcutaneous QHS Leda Gauze, NP   28 Units at 04/03/15 2301  . insulin glargine (LANTUS) injection 30 Units  30 Units Subcutaneous Daily Leda Gauze, NP   30 Units at 04/04/15 1059  . levothyroxine (SYNTHROID, LEVOTHROID) tablet 25 mcg  25 mcg Oral QAC breakfast Alison Murray, MD   25 mcg at 04/04/15 0900  . loratadine (CLARITIN) tablet 10 mg  10 mg Oral Daily Alison Murray, MD   10 mg at 04/04/15 1059  . mometasone-formoterol (DULERA) 100-5 MCG/ACT inhaler 2 puff  2 puff Inhalation BID Alison Murray, MD   2 puff at 04/04/15 [phone]  . ondansetron (ZOFRAN) tablet 4 mg  4 mg Oral Q6H PRN Alison Murray, MD       Or  . ondansetron [facility]) injection 4 mg  4 mg Intravenous Q6H PRN Alison Murray, MD      . oxybutynin (DITROPAN) tablet 10 mg  10 mg Oral QHS Alison Murray, MD   10 mg at 04/03/15 2257  . oxyCODONE-acetaminophen (PERCOCET/ROXICET) 5-325 MG per tablet 1 tablet  1 tablet Oral Q4H PRN Alison Murray, MD      . pantoprazole (PROTONIX) EC tablet 40 mg  40 mg Oral BID Alison Murray, MD   40 mg at 04/04/15 1059  . simvastatin (ZOCOR) tablet 5 mg  5 mg Oral QPM Alison Murray, MD   5 mg at 04/03/15 2259  . sodium chloride 0.9 % injection 3 mL  3 mL Intravenous Q12H Alison Murray, MD   3 mL at 04/03/15 2303  . sucralfate (CARAFATE) tablet 1 g  1 g Oral QHS Alison Murray, MD   1 g at 04/03/15 2259   Do not use this list as official medication orders. Please verify with discharge summary.  Discharge Medications:   Medication List    ASK your doctor about these medications        acetaminophen 325 MG tablet  Commonly known as:  TYLENOL  Take 650 mg by mouth 3 (three) times daily.     albuterol 108 (90 BASE) MCG/ACT inhaler  Commonly known as:  PROVENTIL  HFA;VENTOLIN HFA  Inhale 2 puffs into the lungs every 6 (six) hours as needed for wheezing or shortness of breath.     cholecalciferol 1000 UNITS tablet  Commonly known as:  VITAMIN D  Take 1,000 Units by  mouth every morning.     Cranberry 450 MG Tabs  Take 1 tablet by mouth 2 (two) times daily.     diclofenac sodium 1 % Gel  Commonly known as:  VOLTAREN  Apply 2 g topically 2 (two) times daily.     donepezil 5 MG tablet  Commonly known as:  ARICEPT  Take 1 tablet (5 mg total) by mouth at bedtime.     escitalopram 20 MG tablet  Commonly known as:  LEXAPRO  Take 20 mg by mouth daily with breakfast.     fexofenadine 60 MG tablet  Commonly known as:  ALLEGRA  Take 60 mg by mouth daily with breakfast.     fluticasone 50 MCG/ACT nasal spray  Commonly known as:  FLONASE  Place 1 spray into both nostrils at bedtime.     Fluticasone-Salmeterol 250-50 MCG/DOSE Aepb  Commonly known as:  ADVAIR  Inhale 1 puff into the lungs 2 (two) times daily. Rinse and spit after each use.     furosemide 40 MG tablet  Commonly known as:  LASIX  Take 40 mg by mouth daily with breakfast.     HUMALOG KWIKPEN 100 UNIT/ML KiwkPen  Generic drug:  insulin lispro  Inject 20 Units into the skin 3 (three) times daily.     insulin glargine 100 UNIT/ML injection  Commonly known as:  LANTUS  Inject 28-30 Units into the skin 2 (two) times daily. Takes 30 units in the morning and 28 units at bedtime     levothyroxine 25 MCG tablet  Commonly known as:  SYNTHROID, LEVOTHROID  Take 25 mcg by mouth every evening.     lisinopril 5 MG tablet  Commonly known as:  PRINIVIL,ZESTRIL  Take 5 mg by mouth daily with breakfast.     nystatin 100000 UNIT/GM Powd  Apply 1 g topically daily with breakfast. Apply under breasts     omeprazole 40 MG capsule  Commonly known as:  PRILOSEC  Take 40 mg by mouth 2 (two) times daily.     simvastatin 5 MG tablet  Commonly known as:  ZOCOR  Take 5 mg by mouth at bedtime.      sucralfate 1 G tablet  Commonly known as:  CARAFATE  Take 1 g by mouth at bedtime.     tiotropium 18 MCG inhalation capsule  Commonly known as:  SPIRIVA  Place 18 mcg into inhaler and inhale at bedtime.     traMADol 50 MG tablet  Commonly known as:  ULTRAM  Take 50 mg by mouth 2 (two) times daily as needed (for pain).        Relevant Imaging Results:  Relevant Lab Results:  Recent Labs    Additional Information    Loleta DickerJoyce S Watson Robarge, LCSW

## 2015-04-04 NOTE — Clinical Social Work Note (Signed)
Clinical Social Work Assessment  Patient Details  Name: Courtney Montgomery MRN: [id number] Date of Birth: Nov 17, 1927  Date of referral:  04/04/15               Reason for consult:  Facility Placement                Permission sought to share information with:  Case Manager, Magazine features editoracility Contact Representative, Family Supports Permission granted to share information::  Yes, Verbal Permission Granted  Name::     [employer]  Agency::  [facility]   Relationship::  Son   Contact Information:  [phone]  Housing/Transportation Living arrangements for the past 2 months:  Assisted Living Facility Source of Information:  Patient, Adult Children Patient Interpreter Needed:  None Criminal Activity/Legal Involvement Pertinent to Current Situation/Hospitalization:  No - Comment as needed Significant Relationships:  Adult Children Lives with:  Facility Resident Do you feel safe going back to the place where you live?  Yes Need for family participation in patient care:  Yes (Comment)  Care giving concerns:  Son Loraine LericheMark expressed concerns regarding patient's progress and requested to speak with MD. CSW advised son to speak with RN or MD regarding patient's progress. No further concerns were expressed at this time.    Social Worker assessment / plan:  CSW received consult that patient is from a facility. CSW introduced self and acknowledged the patient.Patient is alert and oriented to person, place, and time. Patient was unable to discuss with CSW her reasons for being admitted, stating "I do not remember". Patient was calm, cooperative, and pleasant with CSW assessment. Patient informed CSW that [facility] and has been living there for about three years. Patient provided CSW with contact information for son Eliezer BottomMark Cassell [phone])  to discuss D/C plans. CSW informed patient that CSW will make contact with facility as well as son to get additional information. Patient informed  CSW that she would like to return to facility once medically stable and ready for discharge.   CSW contacted son Loraine LericheMark via telephone to discuss D/C plans for patient once medically stable. Son reports no concerns with patient returning [facility] once ready for [facility]. Son informed CSW that patient was admitted due to low hemoglobin. Son requested to speak with MD regarding patient's progress. CSW advised son to call unit to speak with RN or MD. No further needs were requested from son at this time.  CSW made contact with facility and spoke with admissions representative. Representative informed CSW that there are no concerns with the patient returning back to ALF when medically ready for discharge. Per representative, patient's baseline at facility includes assistance with ADL's, and patient at baseline is alert and oriented x3.   Employment status:  Disabled (Comment on whether or not currently receiving Disability) Insurance information:  Medicare PT Recommendations:  Not assessed at this time Information / Referral to community resources:     Patient/Family's Response to care:  Patient is hopeful to return to ALF once medically stable, however, voiced if other recommendations were given then she is willing to do what she needs to do. Son voiced that he would like the patient to get better before returning to facility.   Patient/Family's Understanding of and Emotional Response to Diagnosis, Current Treatment, and Prognosis:  Both patient and son is aware and understanding of current treatment. Patient expressed that she is willing to do whatever she needs to do in order to get the  proper treatment she needs.   Emotional Assessment Appearance:  Appears stated age Attitude/Demeanor/Rapport:   (Calm and Cooperative ) Affect (typically observed):  Accepting, Appropriate, Calm, Pleasant Orientation:  Oriented to Self, Oriented to Place, Oriented to  Time Alcohol / Substance use:  Not  Applicable Psych involvement (Current and /or in the community):  No (Comment)  Discharge Needs  Concerns to be addressed:  No discharge needs identified Readmission within the last 30 days:  No Current discharge risk:  Other (medically unstable) Barriers to Discharge:  Barriers Resolved   Loleta Dicker, LCSW 04/04/2015, 11:01 AM

## 2015-04-04 NOTE — Progress Notes (Addendum)
Inpatient Diabetes Program Recommendations  AACE/ADA: New Consensus Statement on Inpatient Glycemic Control (2015)  Target Ranges:  Prepandial:   less than 140 mg/dL      Peak postprandial:   less than 180 mg/dL (1-2 hours)      Critically ill patients:  140 - 180 mg/dL   Review of Glycemic Control  Diabetes history: DM2 Outpatient Diabetes medications: Lantus 30 units in am and 28 units QHS, Novolog moderate tidwc + 20 units tidwc Current orders for Inpatient glycemic control: Lantus 30 units in am and 28 units QHS, Novolog moderate tidwc + 20 units tidwc HgbA1C pending.  Inpatient Diabetes Program Recommendations:     Agree with orders.  FBS - 200 mg/dL Post-prandial blood sugars slightly elevated. Eating 100% meals. If FBS continues > 180 mg/dL, increase basal insulin to Lantus 32 units QAM and 30 units QHS.  Will follow while inpatient. Thank you. Courtney Montgomery, RD, LDN, CDE Inpatient Diabetes Coordinator [phone]

## 2015-04-04 NOTE — Consult Note (Signed)
Subjective:   HPI  The patient is an 81 year old female who was admitted to the hospital from her adult living facility because of anemia. She was found to have heme positive stool. She has not seen any blood in her stool and denies melena and tells me that no one where she lives reported it to her either. Her hemoglobin was found to be 6.3. She denies abdominal pain or vomiting. She cannot recall if she has ever had an EGD and a colonoscopy. Apparently the hemoglobin was checked because she was feeling tired and sleeping a lot according to her. She has been on Coumadin but it has been stopped.  Review of Systems Denies chest pain  Past Medical History  Diagnosis Date  . Stroke (HCC)   . Neuropathy (HCC)   . Swelling   . General weakness   . CHF (congestive heart failure) (HCC)   . CPAP (continuous positive airway pressure) dependence   . Diabetes mellitus without complication (HCC)   . Allergic rhinitis   . Hyperlipidemia   . Venous insufficiency   . Urinary incontinence   . Anxiety   . Depression   . Osteoarthritis   . COPD (chronic obstructive pulmonary disease) (HCC)   . DVT (deep venous thrombosis) (HCC)   . PE (pulmonary embolism)    Past Surgical History  Procedure Laterality Date  . Cholecystectomy    . Appendectomy    . Cataract extraction, bilateral     Social History   Social History  . Marital Status: Widowed    Spouse Name: N/A  . Number of Children: N/A  . Years of Education: N/A   Occupational History  . retired     Print production planneroffice manager   Social History Main Topics  . Smoking status: Former Games developermoker  . Smokeless tobacco: Never Used     Comment: quit 1991  . Alcohol Use: No  . Drug Use: No  . Sexual Activity: No   Other Topics Concern  . Not on file   Social History Narrative   family history is not on file.  Current facility-administered medications:  .  0.9 %  sodium chloride infusion, , Intravenous, Continuous, Rodolph Bonganiel Thompson V, MD, Last Rate:  50 mL/hr at 04/04/15 0907 .  acetaminophen (TYLENOL) tablet 1,000 mg, 1,000 mg, Oral, Q6H PRN, Alison MurrayAlma M Devine, MD .  albuterol (PROVENTIL) (2.5 MG/3ML) 0.083% nebulizer solution 2.5 mg, 2.5 mg, Nebulization, Q6H PRN, Alison MurrayAlma M Devine, MD .  amLODipine (NORVASC) tablet 5 mg, 5 mg, Oral, q morning - 10a, Alison MurrayAlma M Devine, MD, 5 mg at 04/03/15 2259 .  busPIRone (BUSPAR) tablet 15 mg, 15 mg, Oral, BID, Alison MurrayAlma M Devine, MD, 15 mg at 04/04/15 1059 .  cholecalciferol (VITAMIN D) tablet 1,000 Units, 1,000 Units, Oral, q morning - 10a, Alison MurrayAlma M Devine, MD, 1,000 Units at 04/04/15 1059 .  cyclobenzaprine (FLEXERIL) tablet 10 mg, 10 mg, Oral, QHS PRN, Alison MurrayAlma M Devine, MD .  docusate sodium (COLACE) capsule 100 mg, 100 mg, Oral, q morning - 10a, Alison MurrayAlma M Devine, MD, 100 mg at 04/04/15 1059 .  donepezil (ARICEPT) tablet 5 mg, 5 mg, Oral, QHS, Alison MurrayAlma M Devine, MD, 5 mg at 04/03/15 2258 .  escitalopram (LEXAPRO) tablet 20 mg, 20 mg, Oral, q morning - 10a, Leda GauzeKaren J Kirby-Graham, NP, 20 mg at 04/04/15 1059 .  fluticasone (FLONASE) 50 MCG/ACT nasal spray 1 spray, 1 spray, Each Nare, Daily, Alison MurrayAlma M Devine, MD, 1 spray at 04/04/15 1059 .  guaifenesin (ROBITUSSIN) 100  MG/5ML syrup 100 mg, 100 mg, Oral, Q4H PRN, Alison Murray, MD .  insulin aspart (novoLOG) injection 0-15 Units, 0-15 Units, Subcutaneous, TID WC, Alison Murray, MD, 3 Units at 04/04/15 0900 .  insulin aspart (novoLOG) injection 20 Units, 20 Units, Subcutaneous, TID WC, Alison Murray, MD, 20 Units at 04/04/15 0900 .  insulin glargine (LANTUS) injection 28 Units, 28 Units, Subcutaneous, QHS, Leda Gauze, NP, 28 Units at 04/03/15 2301 .  insulin glargine (LANTUS) injection 30 Units, 30 Units, Subcutaneous, Daily, Leda Gauze, NP, 30 Units at 04/04/15 1059 .  levothyroxine (SYNTHROID, LEVOTHROID) tablet 25 mcg, 25 mcg, Oral, QAC breakfast, Alison Murray, MD, 25 mcg at 04/04/15 0900 .  loratadine (CLARITIN) tablet 10 mg, 10 mg, Oral, Daily, Alison Murray, MD, 10  mg at 04/04/15 1059 .  mometasone-formoterol (DULERA) 100-5 MCG/ACT inhaler 2 puff, 2 puff, Inhalation, BID, Alison Murray, MD, 2 puff at 04/04/15 [phone] .  ondansetron (ZOFRAN) tablet 4 mg, 4 mg, Oral, Q6H PRN **OR** ondansetron (ZOFRAN) injection 4 mg, 4 mg, Intravenous, Q6H PRN, Alison Murray, MD .  oxybutynin [facility]) tablet 10 mg, 10 mg, Oral, QHS, Alison Murray, MD, 10 mg at 04/03/15 2257 .  oxyCODONE-acetaminophen (PERCOCET/ROXICET) 5-325 MG per tablet 1 tablet, 1 tablet, Oral, Q4H PRN, Alison Murray, MD .  pantoprazole (PROTONIX) EC tablet 40 mg, 40 mg, Oral, BID, Alison Murray, MD, 40 mg at 04/04/15 1059 .  simvastatin (ZOCOR) tablet 5 mg, 5 mg, Oral, QPM, Alison Murray, MD, 5 mg at 04/03/15 2259 .  sodium chloride 0.9 % injection 3 mL, 3 mL, Intravenous, Q12H, Alison Murray, MD, 3 mL at 04/03/15 2303 .  sucralfate (CARAFATE) tablet 1 g, 1 g, Oral, QHS, Alison Murray, MD, 1 g at 04/03/15 2259 Allergies  Allergen Reactions  . Pollen Extract Other (See Comments)    unknown     Objective:     BP 155/52 mmHg  Pulse 81  Temp(Src) 98.6 F (37 C) (Oral)  Resp 18  Ht 5' 6.5" (1.689 m)  Wt 115.6 kg (254 lb 13.6 oz)  BMI 40.52 kg/m2  SpO2 92%  She is in no distress  Nonicteric  Heart regular rhythm no murmurs  Lungs clear  Abdomen: Bowel sounds normal, soft, nontender  Laboratory No components found for: D1    Assessment:     Symptomatic anemia  Heme-positive stool      Plan:     Proceed with EGD and colonoscopy to evaluate both the upper and lower GI tracts. I discussed this with patient and she is agreeable. Lab Results  Component Value Date   HGB 7.8* 04/04/2015   HGB DUPLICATE REQUEST  RN NAKIA SEE ACC [id number] 04/03/2015   HGB 6.3* 04/03/2015   HCT 26.7* 04/04/2015   HCT DUPLICATE REQUEST  RN NAKIA SEE ACC [id number] 04/03/2015   HCT 22.1* 04/03/2015   ALKPHOS 46 04/04/2015   ALKPHOS 50 04/03/2015   AST 12* 04/04/2015   AST 11* 04/03/2015   ALT 10*  04/04/2015   ALT 11* 04/03/2015

## 2015-04-05 ENCOUNTER — Encounter (HOSPITAL_COMMUNITY): Payer: Self-pay

## 2015-04-05 ENCOUNTER — Encounter (HOSPITAL_COMMUNITY)
Admission: EM | Disposition: A | Discharge status: Nursing Home (Includes ICF) | Payer: Self-pay | Source: Home / Self Care | Attending: Internal Medicine

## 2015-04-05 ENCOUNTER — Inpatient Hospital Stay (HOSPITAL_COMMUNITY): Payer: Medicare Other | Admitting: Anesthesiology

## 2015-04-05 DIAGNOSIS — F0390 Unspecified dementia without behavioral disturbance: Secondary | ICD-10-CM

## 2015-04-05 DIAGNOSIS — I82409 Acute embolism and thrombosis of unspecified deep veins of unspecified lower extremity: Secondary | ICD-10-CM

## 2015-04-05 DIAGNOSIS — I10 Essential (primary) hypertension: Secondary | ICD-10-CM

## 2015-04-05 DIAGNOSIS — E039 Hypothyroidism, unspecified: Secondary | ICD-10-CM

## 2015-04-05 HISTORY — PX: ESOPHAGOGASTRODUODENOSCOPY (EGD) WITH PROPOFOL: SHX5813

## 2015-04-05 LAB — BASIC METABOLIC PANEL
Anion gap: 7 (ref 5–15)
BUN: 17 mg/dL (ref 6–20)
CHLORIDE: 104 mmol/L (ref 101–111)
CO2: 28 mmol/L (ref 22–32)
CREATININE: 1.2 mg/dL — AB (ref 0.44–1.00)
Calcium: 9.1 mg/dL (ref 8.9–10.3)
GFR calc Af Amer: 46 mL/min — ABNORMAL LOW (ref >60)
GFR calc non Af Amer: 40 mL/min — ABNORMAL LOW (ref >60)
Glucose, Bld: 133 mg/dL — ABNORMAL HIGH (ref 65–99)
Potassium: 4.1 mmol/L (ref 3.5–5.1)
Sodium: 139 mmol/L (ref 135–145)

## 2015-04-05 LAB — CBC
HEMATOCRIT: 27.1 % — AB (ref 36.0–46.0)
Hemoglobin: 8 g/dL — ABNORMAL LOW (ref 12.0–15.0)
MCH: 23.5 pg — AB (ref 26.0–34.0)
MCHC: 29.5 g/dL — AB (ref 30.0–36.0)
MCV: 79.7 fL (ref 78.0–100.0)
Platelets: 250 10*3/uL (ref 150–400)
RBC: 3.4 MIL/uL — ABNORMAL LOW (ref 3.87–5.11)
RDW: 18.6 % — ABNORMAL HIGH (ref 11.5–15.5)
WBC: 9.9 10*3/uL (ref 4.0–10.5)

## 2015-04-05 LAB — MAGNESIUM: Magnesium: 1.9 mg/dL (ref 1.7–2.4)

## 2015-04-05 LAB — PROTIME-INR
INR: 1.27 (ref 0.00–1.49)
Prothrombin Time: 16 seconds — ABNORMAL HIGH (ref 11.6–15.2)

## 2015-04-05 LAB — GLUCOSE, CAPILLARY
GLUCOSE-CAPILLARY: 143 mg/dL — AB (ref 65–99)
GLUCOSE-CAPILLARY: 131 mg/dL — AB (ref 65–99)
Glucose-Capillary: 148 mg/dL — ABNORMAL HIGH (ref 65–99)
Glucose-Capillary: 148 mg/dL — ABNORMAL HIGH (ref 65–99)

## 2015-04-05 LAB — HEMOGLOBIN A1C
HEMOGLOBIN A1C: 8 % — AB (ref 4.8–5.6)
MEAN PLASMA GLUCOSE: 183 mg/dL

## 2015-04-05 SURGERY — ESOPHAGOGASTRODUODENOSCOPY (EGD) WITH PROPOFOL
Anesthesia: Monitor Anesthesia Care

## 2015-04-05 MED ORDER — LACTATED RINGERS IV SOLN
INTRAVENOUS | Status: DC | PRN
  Administered 2015-04-05: 14:00:00 via INTRAVENOUS

## 2015-04-05 MED ORDER — INSULIN GLARGINE 100 UNIT/ML ~~LOC~~ SOLN
10.0000 [IU] | Freq: Every day | SUBCUTANEOUS | Status: DC
  Administered 2015-04-05 – 2015-04-07 (×3): 10 [IU] via SUBCUTANEOUS
  Filled 2015-04-05 (×3): qty 0.1

## 2015-04-05 MED ORDER — INSULIN GLARGINE 100 UNIT/ML ~~LOC~~ SOLN
12.0000 [IU] | Freq: Every day | SUBCUTANEOUS | Status: DC
  Administered 2015-04-05 – 2015-04-06 (×2): 12 [IU] via SUBCUTANEOUS
  Filled 2015-04-05 (×3): qty 0.12

## 2015-04-05 MED ORDER — PROPOFOL 10 MG/ML IV BOLUS
INTRAVENOUS | Status: AC
Start: 2015-04-05 — End: 2015-04-05
  Filled 2015-04-05: qty 20

## 2015-04-05 MED ORDER — PROPOFOL 10 MG/ML IV BOLUS
INTRAVENOUS | Status: AC
  Filled 2015-04-05: qty 20

## 2015-04-05 MED ORDER — PROPOFOL 10 MG/ML IV BOLUS
INTRAVENOUS | Status: DC | PRN
  Administered 2015-04-05: 30 mg via INTRAVENOUS
  Administered 2015-04-05 (×5): 20 mg via INTRAVENOUS

## 2015-04-05 MED ORDER — SODIUM CHLORIDE 0.9 % IV SOLN: INTRAVENOUS | Status: DC

## 2015-04-05 MED ORDER — PEG 3350-KCL-NA BICARB-NACL 420 G PO SOLR
4000.0000 mL | Freq: Once | ORAL | Status: AC
  Administered 2015-04-05: 4000 mL via ORAL

## 2015-04-05 MED ORDER — BUTAMBEN-TETRACAINE-BENZOCAINE 2-2-14 % EX AERO
INHALATION_SPRAY | CUTANEOUS | Status: DC | PRN
  Administered 2015-04-05: 2 via TOPICAL

## 2015-04-05 SURGICAL SUPPLY — 24 items

## 2015-04-05 NOTE — Progress Notes (Signed)
Colonoscopy cancelled due to the presence of solid stool/poor prep.

## 2015-04-05 NOTE — Anesthesia Preprocedure Evaluation (Addendum)
Anesthesia Evaluation  Patient identified by MRN, date of birth, ID band Patient awake    Reviewed: Allergy & Precautions, NPO status , Patient's Chart, lab work & pertinent test results  History of Anesthesia Complications Negative for: history of anesthetic complications  Airway Mallampati: II  TM Distance: >3 FB Neck ROM: Full    Dental  (+) Edentulous Upper, Edentulous Lower   Pulmonary sleep apnea, Continuous Positive Airway Pressure Ventilation and Oxygen sleep apnea , COPD,  COPD inhaler and oxygen dependent, former smoker, PE Nocturnal oxygen   breath sounds clear to auscultation       Cardiovascular hypertension, Pt. on medications (-) angina+ DVT   Rhythm:Regular Rate:Normal     Neuro/Psych CVA (no longer walks), Residual Symptoms    GI/Hepatic Neg liver ROS, GERD  Medicated and Controlled,  Endo/Other  diabetes (glu 148), Insulin DependentHypothyroidism Morbid obesity  Renal/GU Renal InsufficiencyRenal disease (creat 1.20, K+ 4.1)     Musculoskeletal  (+) Arthritis ,   Abdominal (+) + obese,   Peds  Hematology  (+) Blood dyscrasia (Hb 8.0), ,   Anesthesia Other Findings   Reproductive/Obstetrics                            Anesthesia Physical Anesthesia Plan  ASA: III  Anesthesia Plan: MAC   Post-op Pain Management:    Induction: Intravenous  Airway Management Planned: Nasal Cannula and Natural Airway  Additional Equipment:   Intra-op Plan:   Post-operative Plan:   Informed Consent: I have reviewed the patients History and Physical, chart, labs and discussed the procedure including the risks, benefits and alternatives for the proposed anesthesia with the patient or authorized representative who has indicated his/her understanding and acceptance.   Dental advisory given  Plan Discussed with: CRNA and Surgeon  Anesthesia Plan Comments: (Plan routine monitors, MAC)         Anesthesia Quick Evaluation

## 2015-04-05 NOTE — Anesthesia Postprocedure Evaluation (Signed)
  Anesthesia Post-op Note  Patient: Courtney Montgomery  Procedure(s) Performed: Procedure(s): ESOPHAGOGASTRODUODENOSCOPY (EGD) WITH PROPOFOL (N/A)  Patient Location: Endoscopy Unit  Anesthesia Type:MAC  Level of Consciousness: awake, alert , oriented and patient cooperative  Airway and Oxygen Therapy: Patient Spontanous Breathing and Patient connected to nasal cannula oxygen  Post-op Pain: none  Post-op Assessment: Post-op Vital signs reviewed, Patient's Cardiovascular Status Stable, Respiratory Function Stable, Patent Airway, No signs of Nausea or vomiting and Pain level controlled              Post-op Vital Signs: Reviewed and stable  Last Vitals:  Filed Vitals:   04/05/15 1504  BP: 147/36  Pulse:   Temp:   Resp: 22    Complications: No apparent anesthesia complications

## 2015-04-05 NOTE — Transfer of Care (Signed)
Immediate Anesthesia Transfer of Care Note  Patient: Courtney Montgomery  Procedure(s) Performed: Procedure(s): ESOPHAGOGASTRODUODENOSCOPY (EGD) WITH PROPOFOL (N/A)  Patient Location: PACU and Endoscopy Unit  Anesthesia Type:MAC  Level of Consciousness: awake, oriented, patient cooperative, lethargic and responds to stimulation  Airway & Oxygen Therapy: Patient Spontanous Breathing and Patient connected to nasal cannula oxygen  Post-op Assessment: Report given to RN, Post -op Vital signs reviewed and stable and Patient moving all extremities  Post vital signs: Reviewed and stable  Last Vitals:  Filed Vitals:   04/05/15 1301  BP: 139/33  Pulse: 65  Temp:   Resp: 21    Complications: No apparent anesthesia complications

## 2015-04-05 NOTE — Op Note (Signed)
[facility] [address] [place], [id number]   ENDOSCOPY PROCEDURE REPORT  PATIENT: Courtney Montgomery, Courtney Montgomery  MR#: [id number] BIRTHDATE: [birth year] , 87  yrs. old GENDER: female ENDOSCOPIST: Wandalee FerdinandSam Mollie Rossano, MD REFERRED BY: PROCEDURE DATE:  04/05/2015 PROCEDURE:  EGD ASA CLASS:     3 INDICATIONS:  nemia, heme positive stool MEDICATIONS: propofol per anesthesia TOPICAL ANESTHETIC:  DESCRIPTION OF PROCEDURE: After the risks benefits and alternatives of the procedure were thoroughly explained, informed consent was obtained.  The EG [id number] ( [id number]( [id number] ) endoscope was introduced through the mouth and advanced to the second portion of the duodenum , Without limitations.  The instrument was slowly withdrawn as the mucosa was fully examined. Estimated blood loss is zero unless otherwise noted in this procedure report.  Esophagus: Normal  Stomach: Normal  Duodenum: Normal    The scope was then withdrawn from the patient and the procedure completed.  COMPLICATIONS: There were no immediate complications.  ENDOSCOPIC IMPRESSION:normal EGD. Nothing to explain anemia.   RECOMMENDATIONS: colonoscopy was planned but canceled because of inadequate preparation. Patient will be reprepped and we will attempt colonoscopy tomorrow.  REPEAT EXAM:  eSignedWandalee Ferdinand:  Sam Dannah Ryles, MD 04/05/2015 2:35 PM    CC:  CPT CODES: ICD CODES:  The ICD and CPT codes recommended by this software are interpretations from the data that the clinical staff has captured with the software.  The verification of the translation of this report to the ICD and CPT codes and modifiers is the sole responsibility of the health care institution and practicing physician where this report was generated.  [employer] will not be held responsible for the validity of the ICD and CPT codes included on this report.  AMA assumes no liability for data contained or not contained herein. CPT is a  Publishing rights managerregistered trademark of the [employer].  PATIENT NAME:  Courtney Montgomery, Chrisa MR#: [id number]

## 2015-04-05 NOTE — Progress Notes (Signed)
Patient states she does not want to drink prep tonight. RN discussed that colonoscopy cannot be done without prep, patient states she and her son are aware and have discussed this decision and they would like to cancel the colonoscopy. Patient requesting solid food. Will contact on call about diet change.

## 2015-04-05 NOTE — Progress Notes (Signed)
[facility] report patient was "max assist" prior to admission to hospital- they were using a hoyer left with her and are comfortable taking her back at dc with HH (PT, etc). Will update patient and family and follow for further dc planning.   Reece LevyJanet Daylyn Christine, MSW, Theresia MajorsLCSWA  [phone]

## 2015-04-05 NOTE — Progress Notes (Signed)
[facility] PROGRESS NOTE  Courtney Montgomery ZOX:[id number] DOB: 08-03-1927 DOA: 04/03/2015 PCP: [name], [name], PA-C  Assessment/Plan: #1 symptomatic anemia Questionable etiology. Patient with no overt bleeding. Patient status post 2 units packed red blood cells. Hemoglobin currently of 8.0 from 6.3 on admission. Continue Protonix and Carafate. Patient has been seen in consultation by gastroenterology. Patient s/p EGD which was negative. Colonoscopy planned for tomorrow due to inadequate prep.  Follow H&H.  #2 hypothyroidism TSH = 1.675. Continue Synthroid.  #3 diabetes mellitus Hemoglobin A1c = 8. CBGs 101-148 Decrease Lantus as patient NPO for procedures.  #4 hypertension Continue Norvasc. Lasix and lisinopril on hold secondary to worsening renal function.  #5 hyperlipidemia Continue statin.  #6 history of DVT Anticoagulation on hold secondary to problem #1. After GI workup GI to advise when anticoagulation can be resumed.  #7 acute renal failure superimposed on chronic kidney disease stage III Baseline creatinine 2 years ago was 1.26. Likely secondary to diuretics and nephrotoxins. ACE inhibitor and diuretics on hold. Renal function improving. Continue gentle hydration. Follow.  #8 dementia Stable.  #9 prophylaxis PPI for GI prophylaxis. SCDs for DVT prophylaxis.  Code Status: Full Family Communication: Updated patient. No family present. Disposition Plan: When medically stable and hemoglobin has stabilized and post GI evaluation.   Consultants:  Gastroenterology: Dr. Evette CristalGanem 04/04/2015  Procedures: 2 units packed red blood cells 04/03/2015 Upper endoscopy 04/05/2015  Dr Evette CristalGanem  Antibiotics:  IV Rocephin 04/03/2015>>..04/03/2015  HPI/Subjective: Patient laying in bed. No complaints. No shortness of breath. No chest pain. Some improvement with fatigue.  Objective: Filed Vitals:   04/05/15 1504  BP: 147/36  Pulse:   Temp:   Resp: 22    Intake/Output  Summary (Last 24 hours) at 04/05/15 1526 Last data filed at 04/05/15 1430  Gross per 24 hour  Intake   1010 ml  Output      0 ml  Net   1010 ml   Filed Weights   04/03/15 1432 04/03/15 1850 04/05/15 0633  Weight: 113.399 kg (250 lb) 115.6 kg (254 lb 13.6 oz) 118.1 kg (260 lb 5.8 oz)    Exam:   General:  NAD  Cardiovascular: RRR  Respiratory: CTAB  Abdomen: Soft, nontender, nondistended, positive bowel sounds.  Musculoskeletal: No clubbing cyanosis or edema.  Data Reviewed: Basic Metabolic Panel:  Recent Labs Lab 04/03/15 1435 04/04/15 0530 04/05/15 0520  NA 134* 136 139  K 4.0 4.0 4.1  CL 102 104 104  CO2 25 26 28   GLUCOSE 263* 200* 133*  BUN 21* 19 17  CREATININE 1.42* 1.22* 1.20*  CALCIUM 9.2 9.2 9.1  MG  --  1.8 1.9  PHOS  --  3.6  --    Liver Function Tests:  Recent Labs Lab 04/03/15 1435 04/04/15 0530  AST 11* 12*  ALT 11* 10*  ALKPHOS 50 46  BILITOT 0.2* 0.4  PROT 7.0 6.8  ALBUMIN 3.6 3.4*   No results for input(s): LIPASE, AMYLASE in the last 168 hours. No results for input(s): AMMONIA in the last 168 hours. CBC:  Recent Labs Lab 04/03/15 1435 04/03/15 1900 04/04/15 0530 04/05/15 0520  WBC 9.6 DUPLICATE REQUEST  RN NAKIA SEE ACC [id number] 9.9 9.9  NEUTROABS  --  PENDING 7.2  --   HGB 6.3* DUPLICATE REQUEST  RN NAKIA SEE ACC [id number] 7.8* 8.0*  HCT 22.1* DUPLICATE REQUEST  RN NAKIA SEE ACC [id number] 26.7* 27.1*  MCV 76.2* DUPLICATE REQUEST  RN NAKIA SEE ACC [id number] 77.6* 79.7  PLT 287 DUPLICATE REQUEST  RN NAKIA SEE ACC [id number] 244 250   Cardiac Enzymes:  Recent Labs Lab 04/03/15 1513  TROPONINI <0.03   BNP (last 3 results) No results for input(s): BNP in the last 8760 hours.  ProBNP (last 3 results) No results for input(s): PROBNP in the last 8760 hours.  CBG:  Recent Labs Lab 04/04/15 2130 04/04/15 2133 04/04/15 2210 04/05/15 0726 04/05/15 1234  GLUCAP 57* 61* 101* 143* 148*    No results found for this or any previous visit  (from the past 240 hour(s)).   Studies: No results found.  Scheduled Meds: . amLODipine  5 mg Oral q morning - 10a  . busPIRone  15 mg Oral BID  . cholecalciferol  1,000 Units Oral q morning - 10a  . docusate sodium  100 mg Oral q morning - 10a  . donepezil  5 mg Oral QHS  . escitalopram  20 mg Oral q morning - 10a  . fluticasone  1 spray Each Nare Daily  . insulin aspart  0-15 Units Subcutaneous TID WC  . insulin aspart  20 Units Subcutaneous TID WC  . insulin glargine  10 Units Subcutaneous Daily  . insulin glargine  12 Units Subcutaneous QHS  . levothyroxine  25 mcg Oral QAC breakfast  . loratadine  10 mg Oral Daily  . mometasone-formoterol  2 puff Inhalation BID  . oxybutynin  10 mg Oral QHS  . pantoprazole  40 mg Oral BID  . polyethylene glycol-electrolytes  4,000 mL Oral Once  . simvastatin  5 mg Oral QPM  . sodium chloride  3 mL Intravenous Q12H  . sucralfate  1 g Oral QHS   Continuous Infusions: . sodium chloride      Principal Problem:   Symptomatic anemia Active Problems:   UTI (urinary tract infection)   PE (pulmonary embolism)   Dementia without behavioral disturbance   Depression   Dyslipidemia associated with type 2 diabetes mellitus (HCC)   Controlled diabetes mellitus with diabetic neuropathy, with long-term current use of insulin (HCC)   Essential hypertension   Hypothyroidism   Acute renal failure superimposed on stage 3 chronic kidney disease (HCC)   Anemia of chronic kidney failure   Deep vein thrombosis (DVT) of lower extremity (HCC)    Time spent: 35 minutes    THOMPSON,DANIEL M.D. [facility] Pager [phone]. If 7PM-7AM, please contact night-coverage at [email or web address], password [facility] 04/05/2015, 3:26 PM  LOS: 2 days

## 2015-04-05 NOTE — Progress Notes (Signed)
Initial Nutrition Assessment  DOCUMENTATION CODES:   Obesity unspecified  INTERVENTION:  - Patient not appropriate for diet education at this time.  - RD team will continue to monitor for needs.   NUTRITION DIAGNOSIS:   Limited adherence to nutrition-related recommendations related to lethargy/confusion, limited prior education as evidenced by per patient/family report.   GOAL:   Patient will meet greater than or equal to 90% of their needs   MONITOR:   Labs, Supplement acceptance, Diet advancement, PO intake, Weight trends  REASON FOR ASSESSMENT:   Consult Diet education  ASSESSMENT:   81 year old female with past medical history of dementia, hypertension, CKD stage 3, diabetes mellitus on insulin, DVT/PE on coumadin who os from ALF and has presented from there to [facility] ED with low hemoglobin and stool positive for occult blood. Patient reports no evidence of bleeding (blood in stool or melena) but due to dementia she is not reliable historian. She says she feels tired and sometime short of breat but no reports of chest pain, lightheadedness or loss of consciousness. No reports of abdominal pain, nausea or vomiting. No fevers. She can't recall if she had colonoscopy or EGD in past.   Patient sitting in bed, and amenable during assessment for diet education consult. No other family was present in room.   Per chart, patient has a prior history of dementia, and excessive fatigue and sleepiness. RD student observed that patient appears well nourished. Patient currently NPO and awaiting upper endoscopy and colonoscopy. Patient has some trouble talking due to dry mouth associated with NPO diet order during visit.   Patient denies any nausea, vomiting, or poor appetite. Per chart, her meal completion is 100%. Diet has changed from carb/modified to clear liquids during admission.    Patient lives at a SNF, where she states that she is on a diabetic diet. She describes her usual diet as:  bacon, raisin-bread, oatmeal and coffee in the morning. Lunch and dinner is usually a sandwich or a hot meal in the cafeteria. She has some trouble recalling normal foods. Beverages usually include unsweetened tea with "yellow packet" sweetener; which she drinks throughout the day. Patient states she takes a multi-vitamin daily.   Discussed with patient prior education she has received for Diabetes. She states she has had little prior education, but has been taking care of her needs for years. She is not currently working with a RD at her facility. Talked with patient about high Hgb A1c lab value (8.0).  Patient quickly states this was due to some recent dessert choices she "couldn't resist". Normally, she states she does not eat too much desert. Per patient her normal glucose is 150.   Encouraged patient to continue the Diabeteic diet choices that she was already making, such as: unsweetened tea, and balancing proteins with carbohydrates to manage blood sugars. RD  Student talked with patient about upcoming challenges during the holidays. Patient denies any need for diet education at this time. She declines any handouts or resources at this time.   Patient currently followed by Diabetes Coordinator.   Will continue to monitor patient for needs. Patient may be more appropriate for diet education in the future, and/or may benefit from RD support at nursing facility to encourage menu selections.   Expect low to moderate compliance. Teach back method used.   Labs reviewed: CBG (39-214), low Heme (6.3), low MCV (76.2), low MCH (23.5), high Hgb A1c (8.0).  Medications reviewed.   Diet Order:  Diet NPO  time specified Except for: Sips with Meds  Skin:  Reviewed, no issues  Last BM:  11/3  Height:   Ht Readings from Last 1 Encounters:  04/03/15 5' 6.5" (1.689 m)    Weight:   Wt Readings from Last 1 Encounters:  04/05/15 260 lb 5.8 oz (118.1 kg)    Ideal Body Weight:  60.2 kg  BMI:  Body  mass index is 41.4 kg/(m^2).  Estimated Nutritional Needs:   Kcal:  1450-1650  Protein:  75-85 gm  Fluid:  >\= 1.5 L/day  EDUCATION NEEDS:   Education needs no appropriate at this time  Delano MetzMaggie Yosiel Thieme, Dietetic Intern 04/05/2015 11:52 AM

## 2015-04-05 NOTE — Progress Notes (Signed)
Patient poorly tolerating colonoscopy prep, which was started 11/2 1600. RN and NT encouraging patient to drink prep every 15-30 minutes, however patient is sleeping/drowsy and takes only small sips before refusing to take more. She states that taking in so much fluid makes her nauseated. RN and NT will continue to encourage pt to drink prep.

## 2015-04-06 ENCOUNTER — Encounter (HOSPITAL_COMMUNITY)
Admission: EM | Disposition: A | Discharge status: Nursing Home (Includes ICF) | Payer: Self-pay | Source: Home / Self Care | Attending: Internal Medicine

## 2015-04-06 ENCOUNTER — Encounter (HOSPITAL_COMMUNITY): Payer: Self-pay | Admitting: Gastroenterology

## 2015-04-06 DIAGNOSIS — E1340 Other specified diabetes mellitus with diabetic neuropathy, unspecified: Secondary | ICD-10-CM

## 2015-04-06 DIAGNOSIS — Z794 Long term (current) use of insulin: Secondary | ICD-10-CM

## 2015-04-06 LAB — BASIC METABOLIC PANEL
ANION GAP: 8 (ref 5–15)
BUN: 12 mg/dL (ref 6–20)
CALCIUM: 9.2 mg/dL (ref 8.9–10.3)
CO2: 29 mmol/L (ref 22–32)
CREATININE: 1 mg/dL (ref 0.44–1.00)
Chloride: 103 mmol/L (ref 101–111)
GFR calc Af Amer: 58 mL/min — ABNORMAL LOW (ref >60)
GFR, EST NON AFRICAN AMERICAN: 50 mL/min — AB (ref >60)
GLUCOSE: 138 mg/dL — AB (ref 65–99)
Potassium: 4 mmol/L (ref 3.5–5.1)
Sodium: 140 mmol/L (ref 135–145)

## 2015-04-06 LAB — GLUCOSE, CAPILLARY
GLUCOSE-CAPILLARY: 148 mg/dL — AB (ref 65–99)
Glucose-Capillary: 177 mg/dL — ABNORMAL HIGH (ref 65–99)
Glucose-Capillary: 208 mg/dL — ABNORMAL HIGH (ref 65–99)

## 2015-04-06 LAB — CBC
HCT: 27.5 % — ABNORMAL LOW (ref 36.0–46.0)
Hemoglobin: 8 g/dL — ABNORMAL LOW (ref 12.0–15.0)
MCH: 23.7 pg — AB (ref 26.0–34.0)
MCHC: 29.1 g/dL — ABNORMAL LOW (ref 30.0–36.0)
MCV: 81.4 fL (ref 78.0–100.0)
PLATELETS: 231 10*3/uL (ref 150–400)
RBC: 3.38 MIL/uL — ABNORMAL LOW (ref 3.87–5.11)
RDW: 19 % — AB (ref 11.5–15.5)
WBC: 10 10*3/uL (ref 4.0–10.5)

## 2015-04-06 LAB — PROTIME-INR
INR: 1.22 (ref 0.00–1.49)
Prothrombin Time: 15.5 seconds — ABNORMAL HIGH (ref 11.6–15.2)

## 2015-04-06 SURGERY — COLONOSCOPY WITH PROPOFOL
Anesthesia: Monitor Anesthesia Care

## 2015-04-06 MED ORDER — SODIUM CHLORIDE 0.9 % IV SOLN: INTRAVENOUS | Status: DC

## 2015-04-06 MED ORDER — SODIUM CHLORIDE 0.9 % IV SOLN
INTRAVENOUS | Status: DC
  Administered 2015-04-06: 10:00:00 via INTRAVENOUS

## 2015-04-06 NOTE — Progress Notes (Signed)
Ganem and endo notified about cancellation of colonoscopy, per patient decision.

## 2015-04-06 NOTE — Progress Notes (Signed)
Patient refuses colonoscopy. Will give her a regular diet. Will sign off, call if needed

## 2015-04-06 NOTE — [place] FL2 (Signed)
[place] MEDICAID FL2 LEVEL OF CARE SCREENING TOOL     IDENTIFICATION  Patient Name: Courtney Montgomery Birthdate: 13-Sep-1927 Sex: female Admission Date (Current Location): 04/03/2015  [facility] and [place] Number: Dispensing optician and Address:  [facility],  [address] [place], [place] [address]      Provider Number: [id number]  Attending Physician Name and Address:  Rodolph Bong, MD  Relative Name and Phone Number:       Current Level of Care: Hospital Recommended Level of Care: Assisted Living Facility Prior Approval Number:    Date Approved/Denied:   PASRR Number: [id number]  Discharge Plan: Other (Comment) (Assisted Living )    Current Diagnoses: Patient Active Problem List   Diagnosis Date Noted  . UTI (urinary tract infection) 04/03/2015  . Dementia without behavioral disturbance 04/03/2015  . Depression 04/03/2015  . Dyslipidemia associated with type 2 diabetes mellitus (HCC) 04/03/2015  . Controlled diabetes mellitus with diabetic neuropathy, with long-term current use of insulin (HCC) 04/03/2015  . Essential hypertension 04/03/2015  . Symptomatic anemia 04/03/2015  . Hypothyroidism 04/03/2015  . Acute renal failure superimposed on stage 3 chronic kidney disease (HCC) 04/03/2015  . Anemia of chronic kidney failure 04/03/2015  . PE (pulmonary embolism)   . Deep vein thrombosis (DVT) of lower extremity (HCC)     Orientation ACTIVITIES/SOCIAL BLADDER RESPIRATION    Self, Time, Place  Active Incontinent Normal  BEHAVIORAL SYMPTOMS/MOOD NEUROLOGICAL BOWEL NUTRITION STATUS      Continent Diet (carb modified )  PHYSICIAN VISITS COMMUNICATION OF NEEDS Height & Weight Skin    Verbally 5' 6.5" (168.9 cm) 254 lbs. Normal          AMBULATORY STATUS RESPIRATION    Supervision limited Normal      Personal Care Assistance Level of Assistance  Bathing, Dressing Bathing Assistance: Limited assistance   Dressing Assistance: Limited assistance      Functional Limitations Info    Sight Info: Adequate           SPECIAL CARE FACTORS FREQUENCY  PT (By licensed PT), OT (By licensed OT)                   Additional Factors Info  Allergies, Code Status, Psychotropic, Insulin Sliding Scale Code Status Info: Full Allergies Info: Pollen Extract Psychotropic Info: Lexapro Insulin Sliding Scale Info: 0-15 units: 3 times a day with meals        Current Medications (04/06/2015): Current Facility-Administered Medications  Medication Dose Route Frequency Provider Last Rate Last Dose  . 0.9 %  sodium chloride infusion   Intravenous Continuous Rodolph Bong, MD 75 mL/hr at 04/06/15 0935    . acetaminophen (TYLENOL) tablet 1,000 mg  1,000 mg Oral Q6H PRN Alison Murray, MD      . albuterol (PROVENTIL) (2.5 MG/3ML) 0.083% nebulizer solution 2.5 mg  2.5 mg Nebulization Q6H PRN Alison Murray, MD   2.5 mg at 04/04/15 2200  . amLODipine (NORVASC) tablet 5 mg  5 mg Oral q morning - 10a Alison Murray, MD   5 mg at 04/06/15 1010  . busPIRone (BUSPAR) tablet 15 mg  15 mg Oral BID Alison Murray, MD   15 mg at 04/06/15 1011  . cholecalciferol (VITAMIN D) tablet 1,000 Units  1,000 Units Oral q morning - 10a Alison Murray, MD   1,000 Units at 04/06/15 1010  . cyclobenzaprine (FLEXERIL) tablet 10 mg  10 mg Oral QHS PRN Alison Murray,  MD      . docusate sodium (COLACE) capsule 100 mg  100 mg Oral q morning - 10a Alison MurrayAlma M Devine, MD   100 mg at 04/06/15 1009  . donepezil (ARICEPT) tablet 5 mg  5 mg Oral QHS Alison MurrayAlma M Devine, MD   5 mg at 04/05/15 2159  . escitalopram (LEXAPRO) tablet 20 mg  20 mg Oral q morning - 10a Leda GauzeKaren J Kirby-Graham, NP   20 mg at 04/06/15 1011  . fluticasone (FLONASE) 50 MCG/ACT nasal spray 1 spray  1 spray Each Nare Daily Alison MurrayAlma M Devine, MD   1 spray at 04/06/15 1011  . guaifenesin (ROBITUSSIN) 100 MG/5ML syrup 100 mg  100 mg Oral Q4H PRN Alison MurrayAlma M Devine, MD      . insulin aspart (novoLOG) injection 0-15 Units  0-15 Units  [facility] Alison MurrayAlma M Devine, MD   2 Units at 04/06/15 [phone]  . insulin glargine (LANTUS) injection 10 Units  10 Units Subcutaneous Daily Rodolph Bonganiel Thompson V, MD   10 Units at 04/06/15 1016  . insulin glargine (LANTUS) injection 12 Units  12 Units Subcutaneous QHS Rodolph Bonganiel Thompson V, MD   12 Units at 04/05/15 2159  . levothyroxine (SYNTHROID, LEVOTHROID) tablet 25 mcg  25 mcg Oral QAC breakfast Alison MurrayAlma M Devine, MD   25 mcg at 04/06/15 0840  . loratadine (CLARITIN) tablet 10 mg  10 mg Oral Daily Alison MurrayAlma M Devine, MD   10 mg at 04/06/15 1010  . mometasone-formoterol (DULERA) 100-5 MCG/ACT inhaler 2 puff  2 puff Inhalation BID Alison MurrayAlma M Devine, MD   2 puff at 04/06/15 1032  . ondansetron (ZOFRAN) tablet 4 mg  4 mg Oral Q6H PRN Alison MurrayAlma M Devine, MD       Or  . ondansetron [facility](ZOFRAN) injection 4 mg  4 mg Intravenous Q6H PRN Alison MurrayAlma M Devine, MD   4 mg at 04/04/15 2152  . oxybutynin (DITROPAN) tablet 10 mg  10 mg Oral QHS Alison MurrayAlma M Devine, MD   10 mg at 04/05/15 2200  . oxyCODONE-acetaminophen (PERCOCET/ROXICET) 5-325 MG per tablet 1 tablet  1 tablet Oral Q4H PRN Alison MurrayAlma M Devine, MD      . pantoprazole (PROTONIX) EC tablet 40 mg  40 mg Oral BID Alison MurrayAlma M Devine, MD   40 mg at 04/06/15 1010  . simvastatin (ZOCOR) tablet 5 mg  5 mg Oral QPM Alison MurrayAlma M Devine, MD   5 mg at 04/05/15 1746  . sodium chloride 0.9 % injection 3 mL  3 mL Intravenous Q12H Alison MurrayAlma M Devine, MD   3 mL at 04/05/15 2200  . sucralfate (CARAFATE) tablet 1 g  1 g Oral QHS Alison MurrayAlma M Devine, MD   1 g at 04/05/15 2159   Do not use this list as official medication orders. Please verify with discharge summary.  Discharge Medications:   Medication List    ASK your doctor about these medications        acetaminophen 325 MG tablet  Commonly known as:  TYLENOL  Take 650 mg by mouth 3 (three) times daily.     albuterol 108 (90 BASE) MCG/ACT inhaler  Commonly known as:  PROVENTIL HFA;VENTOLIN HFA  Inhale 2 puffs into the lungs every 6 (six) hours as needed for wheezing  or shortness of breath.     cholecalciferol 1000 UNITS tablet  Commonly known as:  VITAMIN D  Take 1,000 Units by mouth every morning.     Cranberry 450 MG Tabs  Take 1 tablet  by mouth 2 (two) times daily.     diclofenac sodium 1 % Gel  Commonly known as:  VOLTAREN  Apply 2 g topically 2 (two) times daily.     donepezil 5 MG tablet  Commonly known as:  ARICEPT  Take 1 tablet (5 mg total) by mouth at bedtime.     escitalopram 20 MG tablet  Commonly known as:  LEXAPRO  Take 20 mg by mouth daily with breakfast.     fexofenadine 60 MG tablet  Commonly known as:  ALLEGRA  Take 60 mg by mouth daily with breakfast.     fluticasone 50 MCG/ACT nasal spray  Commonly known as:  FLONASE  Place 1 spray into both nostrils at bedtime.     Fluticasone-Salmeterol 250-50 MCG/DOSE Aepb  Commonly known as:  ADVAIR  Inhale 1 puff into the lungs 2 (two) times daily. Rinse and spit after each use.     furosemide 40 MG tablet  Commonly known as:  LASIX  Take 40 mg by mouth daily with breakfast.     HUMALOG KWIKPEN 100 UNIT/ML KiwkPen  Generic drug:  insulin lispro  Inject 20 Units into the skin 3 (three) times daily.     insulin glargine 100 UNIT/ML injection  Commonly known as:  LANTUS  Inject 28-30 Units into the skin 2 (two) times daily. Takes 30 units in the morning and 28 units at bedtime     levothyroxine 25 MCG tablet  Commonly known as:  SYNTHROID, LEVOTHROID  Take 25 mcg by mouth every evening.     lisinopril 5 MG tablet  Commonly known as:  PRINIVIL,ZESTRIL  Take 5 mg by mouth daily with breakfast.     nystatin 100000 UNIT/GM Powd  Apply 1 g topically daily with breakfast. Apply under breasts     omeprazole 40 MG capsule  Commonly known as:  PRILOSEC  Take 40 mg by mouth 2 (two) times daily.     simvastatin 5 MG tablet  Commonly known as:  ZOCOR  Take 5 mg by mouth at bedtime.     sucralfate 1 G tablet  Commonly known as:  CARAFATE  Take 1 g by mouth at bedtime.      tiotropium 18 MCG inhalation capsule  Commonly known as:  SPIRIVA  Place 18 mcg into inhaler and inhale at bedtime.     traMADol 50 MG tablet  Commonly known as:  ULTRAM  Take 50 mg by mouth 2 (two) times daily as needed (for pain).        Relevant Imaging Results:  Relevant Lab Results:  Recent Labs    Additional Information    Athony Coppa, Dickey Gave, LCSW

## 2015-04-06 NOTE — Progress Notes (Signed)
[facility] PROGRESS NOTE  Courtney AsalJean Montgomery ZOX:[id number] DOB: Oct 04, 1927 DOA: 04/03/2015 PCP: [name], [name], PA-C  Assessment/Plan: #1 symptomatic anemia Questionable etiology. Patient with no overt bleeding. Patient status post 2 units packed red blood cells. Hemoglobin currently of 8.0 from 6.3 on admission. Continue Protonix and Carafate. Patient has been seen in consultation by gastroenterology. Patient s/p EGD which was negative. Colonoscopy planned for today due to inadequate prep.  Patient refused prep overnight and patient currently refusing colonoscopy. Patient with no overt bleeding. H&H stable.  #2 hypothyroidism TSH = 1.675. Continue Synthroid.  #3 diabetes mellitus Hemoglobin A1c = 8. CBGs 131-148. Continue current dose of Lantus. Continue sliding scale insulin.   #4 hypertension Continue Norvasc. Lasix and lisinopril on hold secondary to worsening renal function.  #5 hyperlipidemia Continue statin.  #6 history of DVT Anticoagulation on hold secondary to problem #1. Patient's anticoagulation was held at nursing facility prior to admission secondary to patient's anemia. Patient underwent an upper endoscopy 04/05/2015 with no source of bleeding noted. Patient refuses colonoscopy. Will defer resumption of anticoagulation to PCP at facility.   #7 acute renal failure superimposed on chronic kidney disease stage III Baseline creatinine 2 years ago was 1.26. Likely secondary to diuretics and nephrotoxins. ACE inhibitor and diuretics on hold. Renal function improved. Saline lock IV fluids. Follow.  #8 dementia Stable.  #9 prophylaxis PPI for GI prophylaxis. SCDs for DVT prophylaxis.  Code Status: Full Family Communication: Updated patient. No family present. Disposition Plan: To skilled facility tomorrow 04/07/2015.   Consultants:  Gastroenterology: Dr. Evette CristalGanem 04/04/2015  Procedures: 2 units packed red blood cells 04/03/2015 Upper endoscopy 04/05/2015  Dr  Evette CristalGanem  Antibiotics:  IV Rocephin 04/03/2015>>..04/03/2015  HPI/Subjective: Patient laying in bed. No complaints. No shortness of breath. No chest pain. Patient states she's feeling better now that ate a solid diet. Patient refused colonoscopy.  Objective: Filed Vitals:   04/06/15 1007  BP: 144/62  Pulse: 63  Temp: 97.8 F (36.6 C)  Resp: 20    Intake/Output Summary (Last 24 hours) at 04/06/15 1430 Last data filed at 04/05/15 1800  Gross per 24 hour  Intake    240 ml  Output      0 ml  Net    240 ml   Filed Weights   04/03/15 1432 04/03/15 1850 04/05/15 0633  Weight: 113.399 kg (250 lb) 115.6 kg (254 lb 13.6 oz) 118.1 kg (260 lb 5.8 oz)    Exam:   General:  NAD  Cardiovascular: RRR  Respiratory: CTAB  Abdomen: Soft, nontender, nondistended, positive bowel sounds.  Musculoskeletal: No clubbing cyanosis or edema.  Data Reviewed: Basic Metabolic Panel:  Recent Labs Lab 04/03/15 1435 04/04/15 0530 04/05/15 0520 04/06/15 0525  NA 134* 136 139 140  K 4.0 4.0 4.1 4.0  CL 102 104 104 103  CO2 25 26 28 29   GLUCOSE 263* 200* 133* 138*  BUN 21* 19 17 12   CREATININE 1.42* 1.22* 1.20* 1.00  CALCIUM 9.2 9.2 9.1 9.2  MG  --  1.8 1.9  --   PHOS  --  3.6  --   --    Liver Function Tests:  Recent Labs Lab 04/03/15 1435 04/04/15 0530  AST 11* 12*  ALT 11* 10*  ALKPHOS 50 46  BILITOT 0.2* 0.4  PROT 7.0 6.8  ALBUMIN 3.6 3.4*   No results for input(s): LIPASE, AMYLASE in the last 168 hours. No results for input(s): AMMONIA in the last 168 hours. CBC:  Recent Labs Lab  04/03/15 1435 04/03/15 1900 04/04/15 0530 04/05/15 0520 04/06/15 0525  WBC 9.6 DUPLICATE REQUEST  RN NAKIA SEE ACC T9368 9.9 9.9 10.0  NEUTROABS  --  PENDING 7.2  --   --   HGB 6.3* DUPLICATE REQUEST  RN NAKIA SEE ACC T9368 7.8* 8.0* 8.0*  HCT 22.1* DUPLICATE REQUEST  RN NAKIA SEE ACC T9368 26.7* 27.1* 27.5*  MCV 76.2* DUPLICATE REQUEST  RN NAKIA SEE ACC T9368 77.6* 79.7 81.4  PLT 287  DUPLICATE REQUEST  RN NAKIA SEE ACC T9368 244 250 231   Cardiac Enzymes:  Recent Labs Lab 04/03/15 1513  TROPONINI <0.03   BNP (last 3 results) No results for input(s): BNP in the last 8760 hours.  ProBNP (last 3 results) No results for input(s): PROBNP in the last 8760 hours.  CBG:  Recent Labs Lab 04/05/15 0726 04/05/15 1234 04/05/15 1617 04/05/15 2158 04/06/15 0755  GLUCAP 143* 148* 131* 148* 148*    No results found for this or any previous visit (from the past 240 hour(s)).   Studies: No results found.  Scheduled Meds: . amLODipine  5 mg Oral q morning - 10a  . busPIRone  15 mg Oral BID  . cholecalciferol  1,000 Units Oral q morning - 10a  . docusate sodium  100 mg Oral q morning - 10a  . donepezil  5 mg Oral QHS  . escitalopram  20 mg Oral q morning - 10a  . fluticasone  1 spray Each Nare Daily  . insulin aspart  0-15 Units Subcutaneous TID WC  . insulin glargine  10 Units Subcutaneous Daily  . insulin glargine  12 Units Subcutaneous QHS  . levothyroxine  25 mcg Oral QAC breakfast  . loratadine  10 mg Oral Daily  . mometasone-formoterol  2 puff Inhalation BID  . oxybutynin  10 mg Oral QHS  . pantoprazole  40 mg Oral BID  . simvastatin  5 mg Oral QPM  . sodium chloride  3 mL Intravenous Q12H  . sucralfate  1 g Oral QHS   Continuous Infusions: . sodium chloride 75 mL/hr at 04/06/15 0935    Principal Problem:   Symptomatic anemia Active Problems:   UTI (urinary tract infection)   PE (pulmonary embolism)   Dementia without behavioral disturbance   Depression   Dyslipidemia associated with type 2 diabetes mellitus (HCC)   Controlled diabetes mellitus with diabetic neuropathy, with long-term current use of insulin (HCC)   Essential hypertension   Hypothyroidism   Acute renal failure superimposed on stage 3 chronic kidney disease (HCC)   Anemia of chronic kidney failure   Deep vein thrombosis (DVT) of lower extremity (HCC)    Time spent: 35  minutes    Meadow Abramo M.D. [facility] Pager [phone]. If 7PM-7AM, please contact night-coverage at [email or web address], password [facility] 04/06/2015, 2:30 PM  LOS: 3 days

## 2015-04-06 NOTE — Care Management Important Message (Signed)
Important Message  Patient Details  Name: Courtney Montgomery MRN: [id number] Date of Birth: [birth year]   Medicare Important Message Given:  Yes-second notification given    Haskell FlirtJamison, Chantee Cerino 04/06/2015, 11:11 AMImportant Message  Patient Details  Name: Courtney Montgomery MRN: [id number] Date of Birth: [birth year]   Medicare Important Message Given:  Yes-second notification given    Haskell FlirtJamison, Patrizia Paule 04/06/2015, 11:11 AM

## 2015-04-07 DIAGNOSIS — E0840 Diabetes mellitus due to underlying condition with diabetic neuropathy, unspecified: Secondary | ICD-10-CM

## 2015-04-07 LAB — GLUCOSE, CAPILLARY
Glucose-Capillary: 158 mg/dL — ABNORMAL HIGH (ref 65–99)
Glucose-Capillary: 246 mg/dL — ABNORMAL HIGH (ref 65–99)

## 2015-04-07 LAB — PROTIME-INR
INR: 1.25 (ref 0.00–1.49)
Prothrombin Time: 15.8 seconds — ABNORMAL HIGH (ref 11.6–15.2)

## 2015-04-07 LAB — HEMOGLOBIN AND HEMATOCRIT, BLOOD
HEMATOCRIT: 26.3 % — AB (ref 36.0–46.0)
HEMOGLOBIN: 7.6 g/dL — AB (ref 12.0–15.0)

## 2015-04-07 LAB — CBC
HEMATOCRIT: 25.4 % — AB (ref 36.0–46.0)
Hemoglobin: 7.3 g/dL — ABNORMAL LOW (ref 12.0–15.0)
MCH: 23.1 pg — ABNORMAL LOW (ref 26.0–34.0)
MCHC: 28.7 g/dL — ABNORMAL LOW (ref 30.0–36.0)
MCV: 80.4 fL (ref 78.0–100.0)
Platelets: 225 10*3/uL (ref 150–400)
RBC: 3.16 MIL/uL — ABNORMAL LOW (ref 3.87–5.11)
RDW: 19.1 % — AB (ref 11.5–15.5)
WBC: 11.2 10*3/uL — AB (ref 4.0–10.5)

## 2015-04-07 MED ORDER — FUROSEMIDE 40 MG PO TABS: 40.0000 mg | ORAL_TABLET | Freq: Every day | ORAL | Status: AC

## 2015-04-07 MED ORDER — LISINOPRIL 5 MG PO TABS: 5.0000 mg | ORAL_TABLET | Freq: Every day | ORAL | Status: AC

## 2015-04-07 NOTE — Discharge Summary (Signed)
Physician Discharge Summary  Courtney Montgomery ZOX:[id number] DOB: [birth year] DOA: 04/03/2015  PCP: Vista Mink, PA-C  Admit date: 04/03/2015 Discharge date: 04/07/2015  Time spent: 65 minutes  Recommendations for Outpatient Follow-up:  1. Follow-up with MORAITIS, [name], PA-C in 1-2 weeks. On Follow up patient will need a CBC done to follow-up on H&H. Patient need a basic metabolic profile done to follow-up on electrolytes and renal function. Resumption of patient's anticoagulation will be deferred to the PCP.  Discharge Diagnoses:  Principal Problem:   Symptomatic anemia Active Problems:   UTI (urinary tract infection)   PE (pulmonary embolism)   Dementia without behavioral disturbance   Depression   Dyslipidemia associated with type 2 diabetes mellitus (HCC)   Controlled diabetes mellitus with diabetic neuropathy, with long-term current use of insulin (HCC)   Essential hypertension   Hypothyroidism   Acute renal failure superimposed on stage 3 chronic kidney disease (HCC)   Anemia of chronic kidney failure   Deep vein thrombosis (DVT) of lower extremity (HCC)   Discharge Condition: Stable and improved  Diet recommendation: Carb modified  Filed Weights   04/03/15 1432 04/03/15 1850 04/05/15 8119  Weight: 113.399 kg (250 lb) 115.6 kg (254 lb 13.6 oz) 118.1 kg (260 lb 5.8 oz)    History of present illness:  Per Dr Elisabeth Pigeon 81 year old female with past medical history of dementia, hypertension, CKD stage 3, diabetes mellitus on insulin, DVT/PE on coumadin who is from ALF and had presented from there to [facility] ED with low hemoglobin and stool positive for occult blood. Patient reported no evidence of bleeding (blood in stool or melena) but due to dementia she was not reliable historian. She stated she felt tired and sometimes was short of breat but no reports of chest pain, lightheadedness or loss of consciousness. No reports of abdominal pain, nausea or vomiting. No fevers. She  couldn't recall if she had colonoscopy or EGD in past.   In ED, pt was hemodynamically stable. Blood work revealed hemoglobin of 6.3, creatinine 1.42 (baseline 2 years ago was 1.26. She was given dose of rocephin in ED for possible UTI but no UA ordered on this admission. The 2 units of PRBC ordered and started on admission. She was admitted for further evaluation and management of symptomatic anemia.   Hospital Course:  #1 symptomatic anemia Questionable etiology. Patient with no overt bleeding. Patient was admitted and GI consultation obtained. Patient status post 2 units packed red blood cells. Hemoglobin improved from 6.3 on admission to about 7.6 on day of discharge. Patient's hemoglobin stabilized. Patient was maintained on Protonix and Carafate. Patient was seen in consultation by gastroenterology. Patient s/p EGD which was negative. Colonoscopy was planned however unable to be done due to inadequate prep. Patient refused the prep overnight secondary to intolerance and a such colonoscopy was canceled. Patient's hemoglobin stabilized at 7.6. Patient be discharged in stable and improved condition and will follow-up PCP as outpatient.   #2 hypothyroidism TSH = 1.675. Continued on home regimen of Synthroid.  #3 diabetes mellitus Hemoglobin A1c = 8. Patient's meal coverage insulin was held during the hospitalization. Patient was maintained on Lantus and sliding scale insulin.   #4 hypertension Continued on home regimen of Norvasc. Patient's Lasix and lisinopril were held secondary to worsening renal function. Patient's renal function improved and patient's diuretic and ACE inhibitor will be resumed on discharge.  #5 hyperlipidemia Continued on statin.  #6 history of DVT Anticoagulation on hold secondary to problem #1.  Patient's anticoagulation was held at nursing facility prior to admission secondary to patient's anemia. Patient underwent an upper endoscopy 04/05/2015 with no source of  bleeding noted. Patient refused colonoscopy due to intolerance to bowel prep. Patient's hemoglobin stabilized at 7.6. Patient will follow-up with PCP as outpatient and will defer to PCP on resumption of anticoagulation.  #7 acute renal failure superimposed on chronic kidney disease stage III Baseline creatinine 2 years ago was 1.26. Likely secondary to diuretics and nephrotoxins. ACE inhibitor and diuretics were held. Renal function improved. Outpatient follow-up.   #8 dementia Remained stable.  Procedures: 2 units packed red blood cells 04/03/2015 Upper endoscopy 04/05/2015 Dr Evette CristalGanem  Consultations:  Gastroenterology: Dr. Evette CristalGanem 04/04/2015  Discharge Exam: Filed Vitals:   04/07/15 0743  BP:   Pulse: 68  Temp:   Resp: 17    General: NAD Cardiovascular: RRR Respiratory: CTAB  Discharge Instructions   Discharge Instructions    Diet Carb Modified    Complete by:  As directed      Discharge instructions    Complete by:  As directed   Follow up with MORAITIS, [name], PA-C in 1 week.     Increase activity slowly    Complete by:  As directed           Current Discharge Medication List    CONTINUE these medications which have CHANGED   Details  furosemide (LASIX) 40 MG tablet Take 1 tablet (40 mg total) by mouth daily with breakfast. Qty: 30 tablet    lisinopril (PRINIVIL,ZESTRIL) 5 MG tablet Take 1 tablet (5 mg total) by mouth daily with breakfast. Resume on 04/12/2015      CONTINUE these medications which have NOT CHANGED   Details  acetaminophen (TYLENOL) 325 MG tablet Take 650 mg by mouth 3 (three) times daily.    albuterol (PROVENTIL HFA;VENTOLIN HFA) 108 (90 BASE) MCG/ACT inhaler Inhale 2 puffs into the lungs every 6 (six) hours as needed for wheezing or shortness of breath.    cholecalciferol (VITAMIN D) 1000 UNITS tablet Take 1,000 Units by mouth every morning.     Cranberry 450 MG TABS Take 1 tablet by mouth 2 (two) times daily.    diclofenac sodium  (VOLTAREN) 1 % GEL Apply 2 g topically 2 (two) times daily.    donepezil (ARICEPT) 5 MG tablet Take 1 tablet (5 mg total) by mouth at bedtime. Qty: 30 tablet, Refills: 0    escitalopram (LEXAPRO) 20 MG tablet Take 20 mg by mouth daily with breakfast.    fexofenadine (ALLEGRA) 60 MG tablet Take 60 mg by mouth daily with breakfast.    fluticasone (FLONASE) 50 MCG/ACT nasal spray Place 1 spray into both nostrils at bedtime.    Fluticasone-Salmeterol (ADVAIR) 250-50 MCG/DOSE AEPB Inhale 1 puff into the lungs 2 (two) times daily. Rinse and spit after each use.    insulin glargine (LANTUS) 100 UNIT/ML injection Inject 28-30 Units into the skin 2 (two) times daily. Takes 30 units in the morning and 28 units at bedtime    insulin lispro (HUMALOG KWIKPEN) 100 UNIT/ML KiwkPen Inject 20 Units into the skin 3 (three) times daily.    levothyroxine (SYNTHROID, LEVOTHROID) 25 MCG tablet Take 25 mcg by mouth every evening.     nystatin (MYCOSTATIN/NYSTOP) 100000 UNIT/GM POWD Apply 1 g topically daily with breakfast. Apply under breasts    omeprazole (PRILOSEC) 40 MG capsule Take 40 mg by mouth 2 (two) times daily.    simvastatin (ZOCOR) 5 MG tablet Take 5  mg by mouth at bedtime.    sucralfate (CARAFATE) 1 G tablet Take 1 g by mouth at bedtime.    tiotropium (SPIRIVA) 18 MCG inhalation capsule Place 18 mcg into inhaler and inhale at bedtime.    traMADol (ULTRAM) 50 MG tablet Take 50 mg by mouth 2 (two) times daily as needed (for pain).       STOP taking these medications     albuterol (PROVENTIL) (2.5 MG/3ML) 0.083% nebulizer solution      amLODipine (NORVASC) 5 MG tablet      busPIRone (BUSPAR) 15 MG tablet      cyclobenzaprine (FLEXERIL) 10 MG tablet      docusate sodium (COLACE) 100 MG capsule      guaifenesin (ROBITUSSIN) 100 MG/5ML syrup      oxybutynin (DITROPAN) 5 MG tablet      oxyCODONE-acetaminophen (PERCOCET/ROXICET) 5-325 MG per tablet      triamcinolone (NASACORT) 55  MCG/ACT nasal inhaler        Allergies  Allergen Reactions  . Pollen Extract Other (See Comments)    unknown   Follow-up Information    Follow up with MORAITIS, [name], PA-C. Schedule an appointment as soon as possible for a visit in 1 week.   Specialty:  Internal Medicine   Why:  f/u in 1-2 weeks   Contact information:   [address] [place] [address] [phone]        The results of significant diagnostics from this hospitalization (including imaging, microbiology, ancillary and laboratory) are listed below for reference.    Significant Diagnostic Studies: No results found.  Microbiology: No results found for this or any previous visit (from the past 240 hour(s)).   Labs: Basic Metabolic Panel:  Recent Labs Lab 04/03/15 1435 04/04/15 0530 04/05/15 0520 04/06/15 0525  NA 134* 136 139 140  K 4.0 4.0 4.1 4.0  CL 102 104 104 103  CO2 GLUCOSE 263* 200* 133* 138*  BUN 21* CREATININE 1.42* 1.22* 1.20* 1.00  CALCIUM 9.2 9.2 9.1 9.2  MG  --  1.8 1.9  --   PHOS  --  3.6  --   --    Liver Function Tests:  Recent Labs Lab 04/03/15 1435 04/04/15 0530  AST 11* 12*  ALT 11* 10*  ALKPHOS 50 46  BILITOT 0.2* 0.4  PROT 7.0 6.8  ALBUMIN 3.6 3.4*   No results for input(s): LIPASE, AMYLASE in the last 168 hours. No results for input(s): AMMONIA in the last 168 hours. CBC:  Recent Labs Lab 04/03/15 1900 04/04/15 0530 04/05/15 0520 04/06/15 0525 04/07/15 0430 04/07/15 1335  WBC DUPLICATE REQUEST  RN NAKIA SEE ACC [id number] 9.9 9.9 10.0 11.2*  --   NEUTROABS PENDING 7.2  --   --   --   --   HGB DUPLICATE REQUEST  RN NAKIA SEE ACC [id number] 7.8* 8.0* 8.0* 7.3* 7.6*  HCT DUPLICATE REQUEST  RN NAKIA SEE ACC [id number] 26.7* 27.1* 27.5* 25.4* 26.3*  MCV DUPLICATE REQUEST  RN NAKIA SEE ACC [id number] 77.6* 79.7 81.4 80.4  --   PLT DUPLICATE REQUEST  RN NAKIA SEE ACC [id number] 244 250 231 225  --    Cardiac Enzymes:  Recent Labs Lab 04/03/15 1513   TROPONINI <0.03   BNP: BNP (last 3 results) No results for input(s): BNP in the last 8760 hours.  ProBNP (last 3 results) No results for input(s): PROBNP in the last 8760 hours.  CBG:  Recent Labs Lab 04/06/15 0755 04/06/15 1655 04/06/15 2213 04/07/15 0752 04/07/15 1154  GLUCAP 148* 177* 208* 158* 246*       Signed:  Johnmark Geiger MD [facility] 04/07/2015, 2:59 PM

## 2015-04-07 NOTE — Progress Notes (Signed)
Shift progressed uneventfully. Patient slept well overnight and denied any pain. Vital signs remained stable. Patient remains on continuous, supplemental oxygen therapy via nasal cannula, set at 2 liters/minute. Pulse oximetry readings remained consistently  stable > 92%, without evident nocturnal desaturations occurring. Periodic urinary incontinence noted, requiring prompt perineal cleansing and consistent use of a barrier cream after each episode. Patchy, moist, poorly-defined areas of nonblanchable erythema and discoloration overlie the sacrococcygeal area and inner gluteal folds extending to the perianal and genital regions to the inner thighs. Satellite lesions of erythematous, nonpruritic, macular rashes of irregular shape, appear symmetrically in both upper, medial thighs, more prominently occurring and of denser distribution on the right side. Patient is turned and repositioned routinely on her lateral sides, with pillow support behind the back and in between the knees. Both calves kept elevated longitudinally over a pillow for offloading of heel pressure.

## 2015-04-07 NOTE — Clinical Social Work Note (Signed)
CSW received a call that pt was ready for discharge. CSW called pt's ALF [facility] to discuss discharge.  CSW prepared discharge packet and provided it to RN.  Per pt's son he would like to transport pt back to her ALF  .Elray Bubaegina Shivaan Tierno, LCSW [facility] Clinical Social Worker - Weekend Coverage cell #: [phone]

## 2015-04-07 NOTE — Care Management Note (Signed)
Case Management Note  Patient Details  Name: Courtney Montgomery MRN: [id number] Date of Birth: 01-Oct-1927  Subjective/Objective:      Symptomatic anemia              Action/Plan: Scheduled dc to SNF. [facility]   Expected Discharge Date:  04/07/2015               Expected Discharge Plan:  Skilled Nursing Facility  In-House Referral:  Clinical Social Work  Discharge planning Services  CM Consult      Status of Service:  Completed, signed off  Medicare Important Message Given:  Yes-second notification given Date Medicare IM Given:    Medicare IM give by:    Date Additional Medicare IM Given:    Additional Medicare Important Message give by:     If discussed at Long Length of Stay Meetings, dates discussed:    Additional Comments:  Elliot CousinShavis, Courtney Lennon Ellen, RN 04/07/2015, 3:49 PM

## 2015-04-07 NOTE — NC FL2 (Signed)
[place] MEDICAID FL2 LEVEL OF CARE SCREENING TOOL     IDENTIFICATION  Patient Name: Courtney Montgomery Birthdate: 07-13-1927 Sex: female Admission Date (Current Location): 04/03/2015  [facility] [place]    Facility and Address:  [facility],  [address], [place] [id number]      Provider Number: [id number]  Attending Physician Name and Address:  Rodolph Bonganiel Thompson V, MD  Relative Name and Phone Number:       Current Level of Care: Hospital Recommended Level of Care: Assisted Living Facility Prior Approval Number:    Date Approved/Denied:   PASRR Number: [phone]  Discharge Plan: Other (Comment) (Assisted Living )    Current Diagnoses: Patient Active Problem List   Diagnosis Date Noted  . UTI (urinary tract infection) 04/03/2015  . Dementia without behavioral disturbance 04/03/2015  . Depression 04/03/2015  . Dyslipidemia associated with type 2 diabetes mellitus (HCC) 04/03/2015  . Controlled diabetes mellitus with diabetic neuropathy, with long-term current use of insulin (HCC) 04/03/2015  . Essential hypertension 04/03/2015  . Symptomatic anemia 04/03/2015  . Hypothyroidism 04/03/2015  . Acute renal failure superimposed on stage 3 chronic kidney disease (HCC) 04/03/2015  . Anemia of chronic kidney failure 04/03/2015  . PE (pulmonary embolism)   . Deep vein thrombosis (DVT) of lower extremity (HCC)     Orientation ACTIVITIES/SOCIAL BLADDER RESPIRATION    Self, Time, Place  Active Incontinent Normal  BEHAVIORAL SYMPTOMS/MOOD NEUROLOGICAL BOWEL NUTRITION STATUS      Continent Diet (carb modified )  PHYSICIAN VISITS COMMUNICATION OF NEEDS Height & Weight Skin    Verbally 5' 6.5" (168.9 cm) 254 lbs. Normal          AMBULATORY STATUS RESPIRATION    Supervision limited Normal      Personal Care Assistance Level of Assistance  Bathing, Dressing Bathing Assistance: Limited assistance   Dressing Assistance: Limited assistance      Functional Limitations Info    Sight Info: Adequate           SPECIAL CARE FACTORS FREQUENCY  PT (By licensed PT), OT (By licensed OT)                   Additional Factors Info  Allergies, Code Status, Psychotropic, Insulin Sliding Scale Code Status Info: Full Allergies Info: Pollen Extract Psychotropic Info: Lexapro Insulin Sliding Scale Info: 0-15 units: 3 times a day with meals        Current Medications (04/07/2015): Current Facility-Administered Medications  Medication Dose Route Frequency Provider Last Rate Last Dose  . acetaminophen (TYLENOL) tablet 1,000 mg  1,000 mg Oral Q6H PRN Alison MurrayAlma M Devine, MD      . albuterol (PROVENTIL) (2.5 MG/3ML) 0.083% nebulizer solution 2.5 mg  2.5 mg Nebulization Q6H PRN Alison MurrayAlma M Devine, MD   2.5 mg at 04/04/15 2200  . amLODipine (NORVASC) tablet 5 mg  5 mg Oral q morning - 10a Alison MurrayAlma M Devine, MD   5 mg at 04/07/15 0909  . busPIRone (BUSPAR) tablet 15 mg  15 mg Oral BID Alison MurrayAlma M Devine, MD   15 mg at 04/07/15 0909  . cholecalciferol (VITAMIN D) tablet 1,000 Units  1,000 Units Oral q morning - 10a Alison MurrayAlma M Devine, MD   1,000 Units at 04/07/15 0909  . cyclobenzaprine (FLEXERIL) tablet 10 mg  10 mg Oral QHS PRN Alison MurrayAlma M Devine, MD      . docusate sodium (COLACE) capsule 100 mg  100 mg Oral q morning - 10a Alma M  Elisabeth Pigeon, MD   100 mg at 04/07/15 0909  . donepezil (ARICEPT) tablet 5 mg  5 mg Oral QHS Alison Murray, MD   5 mg at 04/06/15 2216  . escitalopram (LEXAPRO) tablet 20 mg  20 mg Oral q morning - 10a Leda Gauze, NP   20 mg at 04/07/15 0909  . fluticasone (FLONASE) 50 MCG/ACT nasal spray 1 spray  1 spray Each Nare Daily Alison Murray, MD   1 spray at 04/07/15 0910  . guaifenesin (ROBITUSSIN) 100 MG/5ML syrup 100 mg  100 mg Oral Q4H PRN Alison Murray, MD      . insulin aspart (novoLOG) injection 0-15 Units  0-15 Units Subcutaneous TID [facility] Alison Murray, MD   5 Units at 04/07/15 1258  . insulin glargine (LANTUS) injection 10 Units  10  Units Subcutaneous Daily Rodolph Bong, MD   10 Units at 04/07/15 1002  . insulin glargine (LANTUS) injection 12 Units  12 Units Subcutaneous QHS Rodolph Bong, MD   12 Units at 04/06/15 2217  . levothyroxine (SYNTHROID, LEVOTHROID) tablet 25 mcg  25 mcg Oral QAC breakfast Alison Murray, MD   25 mcg at 04/07/15 0908  . loratadine (CLARITIN) tablet 10 mg  10 mg Oral Daily Alison Murray, MD   10 mg at 04/07/15 0909  . mometasone-formoterol (DULERA) 100-5 MCG/ACT inhaler 2 puff  2 puff Inhalation BID Alison Murray, MD   2 puff at 04/07/15 [phone]  . ondansetron (ZOFRAN) tablet 4 mg  4 mg Oral Q6H PRN Alison Murray, MD       Or  . ondansetron [facility]) injection 4 mg  4 mg Intravenous Q6H PRN Alison Murray, MD   4 mg at 04/04/15 2152  . oxybutynin (DITROPAN) tablet 10 mg  10 mg Oral QHS Alison Murray, MD   10 mg at 04/06/15 2216  . oxyCODONE-acetaminophen (PERCOCET/ROXICET) 5-325 MG per tablet 1 tablet  1 tablet Oral Q4H PRN Alison Murray, MD      . pantoprazole (PROTONIX) EC tablet 40 mg  40 mg Oral BID Alison Murray, MD   40 mg at 04/07/15 0909  . simvastatin (ZOCOR) tablet 5 mg  5 mg Oral QPM Alison Murray, MD   5 mg at 04/06/15 1753  . sodium chloride 0.9 % injection 3 mL  3 mL Intravenous Q12H Alison Murray, MD   3 mL at 04/06/15 2230  . sucralfate (CARAFATE) tablet 1 g  1 g Oral QHS Alison Murray, MD   1 g at 04/06/15 2216   Do not use this list as official medication orders. Please verify with discharge summary.  Discharge Medications:   Medication List    TAKE these medications        acetaminophen 325 MG tablet  Commonly known as:  TYLENOL  Take 650 mg by mouth 3 (three) times daily.     albuterol 108 (90 BASE) MCG/ACT inhaler  Commonly known as:  PROVENTIL HFA;VENTOLIN HFA  Inhale 2 puffs into the lungs every 6 (six) hours as needed for wheezing or shortness of breath.     cholecalciferol 1000 UNITS tablet  Commonly known as:  VITAMIN D  Take 1,000 Units by mouth every  morning.     Cranberry 450 MG Tabs  Take 1 tablet by mouth 2 (two) times daily.     diclofenac sodium 1 % Gel  Commonly known as:  VOLTAREN  Apply 2 g topically  2 (two) times daily.     donepezil 5 MG tablet  Commonly known as:  ARICEPT  Take 1 tablet (5 mg total) by mouth at bedtime.     escitalopram 20 MG tablet  Commonly known as:  LEXAPRO  Take 20 mg by mouth daily with breakfast.     fexofenadine 60 MG tablet  Commonly known as:  ALLEGRA  Take 60 mg by mouth daily with breakfast.     fluticasone 50 MCG/ACT nasal spray  Commonly known as:  FLONASE  Place 1 spray into both nostrils at bedtime.     Fluticasone-Salmeterol 250-50 MCG/DOSE Aepb  Commonly known as:  ADVAIR  Inhale 1 puff into the lungs 2 (two) times daily. Rinse and spit after each use.     furosemide 40 MG tablet  Commonly known as:  LASIX  Take 40 mg by mouth daily with breakfast.     HUMALOG KWIKPEN 100 UNIT/ML KiwkPen  Generic drug:  insulin lispro  Inject 20 Units into the skin 3 (three) times daily.     insulin glargine 100 UNIT/ML injection  Commonly known as:  LANTUS  Inject 28-30 Units into the skin 2 (two) times daily. Takes 30 units in the morning and 28 units at bedtime     levothyroxine 25 MCG tablet  Commonly known as:  SYNTHROID, LEVOTHROID  Take 25 mcg by mouth every evening.     lisinopril 5 MG tablet  Commonly known as:  PRINIVIL,ZESTRIL  Take 5 mg by mouth daily with breakfast.     nystatin 100000 UNIT/GM Powd  Apply 1 g topically daily with breakfast. Apply under breasts     omeprazole 40 MG capsule  Commonly known as:  PRILOSEC  Take 40 mg by mouth 2 (two) times daily.     simvastatin 5 MG tablet  Commonly known as:  ZOCOR  Take 5 mg by mouth at bedtime.     sucralfate 1 G tablet  Commonly known as:  CARAFATE  Take 1 g by mouth at bedtime.     tiotropium 18 MCG inhalation capsule  Commonly known as:  SPIRIVA  Place 18 mcg into inhaler and inhale at bedtime.      traMADol 50 MG tablet  Commonly known as:  ULTRAM  Take 50 mg by mouth 2 (two) times daily as needed (for pain).        Relevant Imaging Results:  Relevant Lab Results:  Recent Labs    Additional Information    Annetta Maw, LCSW

## 2015-04-07 NOTE — Progress Notes (Signed)
Pt left at this time with her son, Courtney Montgomery[facility]. Pt alert and without c/o./ Discharge instructions given to [facility].

## 2015-04-08 NOTE — [place] FL2 (Signed)
[place] MEDICAID FL2 LEVEL OF CARE SCREENING TOOL     IDENTIFICATION  Patient Name: Courtney Montgomery Birthdate: 10-24-1927 Sex: female Admission Date (Current Location): 04/03/2015  [facility] [place]    Facility and Address:  [facility],  [address], [place] [id number]      Provider Number: [id number]  Attending Physician Name and Address:  No att. providers found  Relative Name and Phone Number:       Current Level of Care: Hospital Recommended Level of Care: Assisted Living Facility Prior Approval Number:    Date Approved/Denied:   PASRR Number: [id number]  Discharge Plan: Other (Comment) (Assisted Living )    Current Diagnoses: Patient Active Problem List   Diagnosis Date Noted  . UTI (urinary tract infection) 04/03/2015  . Dementia without behavioral disturbance 04/03/2015  . Depression 04/03/2015  . Dyslipidemia associated with type 2 diabetes mellitus (HCC) 04/03/2015  . Controlled diabetes mellitus with diabetic neuropathy, with long-term current use of insulin (HCC) 04/03/2015  . Essential hypertension 04/03/2015  . Symptomatic anemia 04/03/2015  . Hypothyroidism 04/03/2015  . Acute renal failure superimposed on stage 3 chronic kidney disease (HCC) 04/03/2015  . Anemia of chronic kidney failure 04/03/2015  . PE (pulmonary embolism)   . Deep vein thrombosis (DVT) of lower extremity (HCC)     Orientation ACTIVITIES/SOCIAL BLADDER RESPIRATION    Self, Time, Place  Active Incontinent Normal  BEHAVIORAL SYMPTOMS/MOOD NEUROLOGICAL BOWEL NUTRITION STATUS      Continent Diet ([facility])  PHYSICIAN VISITS COMMUNICATION OF NEEDS Height & Weight Skin    Verbally 5' 6.5" (168.9 cm) 254 lbs. Normal          AMBULATORY STATUS RESPIRATION    Supervision limited Normal      Personal Care Assistance Level of Assistance  Bathing, Dressing Bathing Assistance: Limited assistance   Dressing Assistance: Limited assistance       Functional Limitations Info    Sight Info: Adequate           SPECIAL CARE FACTORS FREQUENCY  PT (By licensed PT), OT (By licensed OT)                   Additional Factors Info  Allergies, Code Status, Psychotropic, Insulin Sliding Scale Code Status Info: Full Allergies Info: Pollen Extract Psychotropic Info: Lexapro Insulin Sliding Scale Info: 0-15 units: 3 times a day with meals        Current Medications (04/08/2015): No current facility-administered medications for this encounter.   Current Outpatient Prescriptions  Medication Sig Dispense Refill  . acetaminophen (TYLENOL) 325 MG tablet Take 650 mg by mouth 3 (three) times daily.    [name]. albuterol (PROVENTIL HFA;VENTOLIN HFA) 108 (90 BASE) MCG/ACT inhaler Inhale 2 puffs into the lungs every 6 (six) hours as needed for wheezing or shortness of breath.    . cholecalciferol (VITAMIN D) 1000 UNITS tablet Take 1,000 Units by mouth every morning.     . Cranberry 450 MG TABS Take 1 tablet by mouth 2 (two) times daily.    . diclofenac sodium (VOLTAREN) 1 % GEL Apply 2 g topically 2 (two) times daily.    [name]. donepezil (ARICEPT) 5 MG tablet Take 1 tablet (5 mg total) by mouth at bedtime. 30 tablet 0  . escitalopram (LEXAPRO) 20 MG tablet Take 20 mg by mouth daily with breakfast.    . fexofenadine (ALLEGRA) 60 MG tablet Take 60 mg by mouth daily with breakfast.    . fluticasone (FLONASE) 50  MCG/ACT nasal spray Place 1 spray into both nostrils at bedtime.    . Fluticasone-Salmeterol (ADVAIR) 250-50 MCG/DOSE AEPB Inhale 1 puff into the lungs 2 (two) times daily. Rinse and spit after each use.    . insulin glargine (LANTUS) 100 UNIT/ML injection Inject 28-30 Units into the skin 2 (two) times daily. Takes 30 units in the morning and 28 units at bedtime    . insulin lispro (HUMALOG KWIKPEN) 100 UNIT/ML KiwkPen Inject 20 Units into the skin 3 (three) times daily.    [name] levothyroxine (SYNTHROID, LEVOTHROID) 25 MCG tablet Take 25 mcg by  mouth every evening.     . nystatin (MYCOSTATIN/NYSTOP) 100000 UNIT/GM POWD Apply 1 g topically daily with breakfast. Apply under breasts    . omeprazole (PRILOSEC) 40 MG capsule Take 40 mg by mouth 2 (two) times daily.    . simvastatin (ZOCOR) 5 MG tablet Take 5 mg by mouth at bedtime.    . sucralfate (CARAFATE) 1 G tablet Take 1 g by mouth at bedtime.    [name] tiotropium (SPIRIVA) 18 MCG inhalation capsule Place 18 mcg into inhaler and inhale at bedtime.    . traMADol (ULTRAM) 50 MG tablet Take 50 mg by mouth 2 (two) times daily as needed (for pain).     Melene Muller ON 04/09/2015] furosemide (LASIX) 40 MG tablet Take 1 tablet (40 mg total) by mouth daily with breakfast. 30 tablet   . [START ON 04/12/2015] lisinopril (PRINIVIL,ZESTRIL) 5 MG tablet Take 1 tablet (5 mg total) by mouth daily with breakfast. Resume on 04/12/2015     Do not use this list as official medication orders. Please verify with discharge summary.  Discharge Medications:   Medication List    TAKE these medications        acetaminophen 325 MG tablet  Commonly known as:  TYLENOL  Take 650 mg by mouth 3 (three) times daily.     albuterol 108 (90 BASE) MCG/ACT inhaler  Commonly known as:  PROVENTIL HFA;VENTOLIN HFA  Inhale 2 puffs into the lungs every 6 (six) hours as needed for wheezing or shortness of breath.     cholecalciferol 1000 UNITS tablet  Commonly known as:  VITAMIN D  Take 1,000 Units by mouth every morning.     Cranberry 450 MG Tabs  Take 1 tablet by mouth 2 (two) times daily.     diclofenac sodium 1 % Gel  Commonly known as:  VOLTAREN  Apply 2 g topically 2 (two) times daily.     donepezil 5 MG tablet  Commonly known as:  ARICEPT  Take 1 tablet (5 mg total) by mouth at bedtime.     escitalopram 20 MG tablet  Commonly known as:  LEXAPRO  Take 20 mg by mouth daily with breakfast.     fexofenadine 60 MG tablet  Commonly known as:  ALLEGRA  Take 60 mg by mouth daily with breakfast.     fluticasone  50 MCG/ACT nasal spray  Commonly known as:  FLONASE  Place 1 spray into both nostrils at bedtime.     Fluticasone-Salmeterol 250-50 MCG/DOSE Aepb  Commonly known as:  ADVAIR  Inhale 1 puff into the lungs 2 (two) times daily. Rinse and spit after each use.     furosemide 40 MG tablet  Commonly known as:  LASIX  Take 1 tablet (40 mg total) by mouth daily with breakfast.  Start taking on:  04/09/2015     HUMALOG KWIKPEN 100 UNIT/ML KiwkPen  Generic drug:  insulin lispro  Inject 20 Units into the skin 3 (three) times daily.     insulin glargine 100 UNIT/ML injection  Commonly known as:  LANTUS  Inject 28-30 Units into the skin 2 (two) times daily. Takes 30 units in the morning and 28 units at bedtime     levothyroxine 25 MCG tablet  Commonly known as:  SYNTHROID, LEVOTHROID  Take 25 mcg by mouth every evening.     lisinopril 5 MG tablet  Commonly known as:  PRINIVIL,ZESTRIL  Take 1 tablet (5 mg total) by mouth daily with breakfast. Resume on 04/12/2015  Start taking on:  04/12/2015     nystatin 100000 UNIT/GM Powd  Apply 1 g topically daily with breakfast. Apply under breasts     omeprazole 40 MG capsule  Commonly known as:  PRILOSEC  Take 40 mg by mouth 2 (two) times daily.     simvastatin 5 MG tablet  Commonly known as:  ZOCOR  Take 5 mg by mouth at bedtime.     sucralfate 1 G tablet  Commonly known as:  CARAFATE  Take 1 g by mouth at bedtime.     tiotropium 18 MCG inhalation capsule  Commonly known as:  SPIRIVA  Place 18 mcg into inhaler and inhale at bedtime.     traMADol 50 MG tablet  Commonly known as:  ULTRAM  Take 50 mg by mouth 2 (two) times daily as needed (for pain).        Relevant Imaging Results:  Relevant Lab Results:  Recent Labs    Additional Information    Annetta Maw, LCSW

## 2015-04-24 ENCOUNTER — Observation Stay (HOSPITAL_COMMUNITY): Payer: Medicare Other

## 2015-04-24 ENCOUNTER — Inpatient Hospital Stay (HOSPITAL_COMMUNITY)
Admission: EM | Admit: 2015-04-24 | Discharge: 2015-04-26 | DRG: 065 | Disposition: A | Discharge status: Home Health | Payer: Medicare Other | Source: Intra-hospital | Attending: Internal Medicine | Admitting: Internal Medicine

## 2015-04-24 ENCOUNTER — Emergency Department (HOSPITAL_COMMUNITY): Payer: Medicare Other

## 2015-04-24 ENCOUNTER — Encounter (HOSPITAL_COMMUNITY): Payer: Self-pay | Admitting: Emergency Medicine

## 2015-04-24 DIAGNOSIS — R29810 Facial weakness: Secondary | ICD-10-CM | POA: Diagnosis present

## 2015-04-24 DIAGNOSIS — G459 Transient cerebral ischemic attack, unspecified: Secondary | ICD-10-CM

## 2015-04-24 DIAGNOSIS — F419 Anxiety disorder, unspecified: Secondary | ICD-10-CM | POA: Diagnosis present

## 2015-04-24 DIAGNOSIS — J309 Allergic rhinitis, unspecified: Secondary | ICD-10-CM | POA: Diagnosis present

## 2015-04-24 DIAGNOSIS — I10 Essential (primary) hypertension: Secondary | ICD-10-CM | POA: Diagnosis present

## 2015-04-24 DIAGNOSIS — E038 Other specified hypothyroidism: Secondary | ICD-10-CM

## 2015-04-24 DIAGNOSIS — Z66 Do not resuscitate: Secondary | ICD-10-CM | POA: Diagnosis present

## 2015-04-24 DIAGNOSIS — I63411 Cerebral infarction due to embolism of right middle cerebral artery: Principal | ICD-10-CM | POA: Diagnosis present

## 2015-04-24 DIAGNOSIS — E1159 Type 2 diabetes mellitus with other circulatory complications: Secondary | ICD-10-CM | POA: Insufficient documentation

## 2015-04-24 DIAGNOSIS — G458 Other transient cerebral ischemic attacks and related syndromes: Secondary | ICD-10-CM

## 2015-04-24 DIAGNOSIS — I6529 Occlusion and stenosis of unspecified carotid artery: Secondary | ICD-10-CM

## 2015-04-24 DIAGNOSIS — Z8673 Personal history of transient ischemic attack (TIA), and cerebral infarction without residual deficits: Secondary | ICD-10-CM

## 2015-04-24 DIAGNOSIS — E1140 Type 2 diabetes mellitus with diabetic neuropathy, unspecified: Secondary | ICD-10-CM | POA: Diagnosis not present

## 2015-04-24 DIAGNOSIS — R4781 Slurred speech: Secondary | ICD-10-CM | POA: Diagnosis present

## 2015-04-24 DIAGNOSIS — N183 Chronic kidney disease, stage 3 unspecified: Secondary | ICD-10-CM

## 2015-04-24 DIAGNOSIS — E785 Hyperlipidemia, unspecified: Secondary | ICD-10-CM | POA: Diagnosis present

## 2015-04-24 DIAGNOSIS — F0390 Unspecified dementia without behavioral disturbance: Secondary | ICD-10-CM | POA: Diagnosis not present

## 2015-04-24 DIAGNOSIS — Z86718 Personal history of other venous thrombosis and embolism: Secondary | ICD-10-CM

## 2015-04-24 DIAGNOSIS — E1122 Type 2 diabetes mellitus with diabetic chronic kidney disease: Secondary | ICD-10-CM | POA: Diagnosis present

## 2015-04-24 DIAGNOSIS — R29707 NIHSS score 7: Secondary | ICD-10-CM | POA: Diagnosis present

## 2015-04-24 DIAGNOSIS — N189 Chronic kidney disease, unspecified: Secondary | ICD-10-CM

## 2015-04-24 DIAGNOSIS — E1169 Type 2 diabetes mellitus with other specified complication: Secondary | ICD-10-CM | POA: Diagnosis not present

## 2015-04-24 DIAGNOSIS — Z794 Long term (current) use of insulin: Secondary | ICD-10-CM

## 2015-04-24 DIAGNOSIS — Z87891 Personal history of nicotine dependence: Secondary | ICD-10-CM

## 2015-04-24 DIAGNOSIS — I872 Venous insufficiency (chronic) (peripheral): Secondary | ICD-10-CM | POA: Diagnosis present

## 2015-04-24 DIAGNOSIS — D631 Anemia in chronic kidney disease: Secondary | ICD-10-CM | POA: Diagnosis present

## 2015-04-24 DIAGNOSIS — I509 Heart failure, unspecified: Secondary | ICD-10-CM | POA: Diagnosis present

## 2015-04-24 DIAGNOSIS — Z86711 Personal history of pulmonary embolism: Secondary | ICD-10-CM

## 2015-04-24 DIAGNOSIS — J449 Chronic obstructive pulmonary disease, unspecified: Secondary | ICD-10-CM | POA: Diagnosis present

## 2015-04-24 DIAGNOSIS — Z6841 Body Mass Index (BMI) 40.0 and over, adult: Secondary | ICD-10-CM

## 2015-04-24 DIAGNOSIS — F329 Major depressive disorder, single episode, unspecified: Secondary | ICD-10-CM | POA: Diagnosis present

## 2015-04-24 DIAGNOSIS — E039 Hypothyroidism, unspecified: Secondary | ICD-10-CM | POA: Diagnosis present

## 2015-04-24 DIAGNOSIS — I639 Cerebral infarction, unspecified: Secondary | ICD-10-CM

## 2015-04-24 DIAGNOSIS — I130 Hypertensive heart and chronic kidney disease with heart failure and stage 1 through stage 4 chronic kidney disease, or unspecified chronic kidney disease: Secondary | ICD-10-CM | POA: Diagnosis present

## 2015-04-24 DIAGNOSIS — I63421 Cerebral infarction due to embolism of right anterior cerebral artery: Secondary | ICD-10-CM | POA: Diagnosis present

## 2015-04-24 LAB — GLUCOSE, CAPILLARY: Glucose-Capillary: 217 mg/dL — ABNORMAL HIGH (ref 65–99)

## 2015-04-24 LAB — DIFFERENTIAL
BASOS ABS: 0 10*3/uL (ref 0.0–0.1)
Basophils Relative: 0 %
Eosinophils Absolute: 0.2 10*3/uL (ref 0.0–0.7)
Eosinophils Relative: 1 %
LYMPHS ABS: 1.6 10*3/uL (ref 0.7–4.0)
LYMPHS PCT: 10 %
MONOS PCT: 9 %
Monocytes Absolute: 1.4 10*3/uL — ABNORMAL HIGH (ref 0.1–1.0)
Neutro Abs: 12.6 10*3/uL — ABNORMAL HIGH (ref 1.7–7.7)
Neutrophils Relative %: 80 %

## 2015-04-24 LAB — COMPREHENSIVE METABOLIC PANEL
ALBUMIN: 3.4 g/dL — AB (ref 3.5–5.0)
ALT: 11 U/L — AB (ref 14–54)
AST: 14 U/L — ABNORMAL LOW (ref 15–41)
Alkaline Phosphatase: 56 U/L (ref 38–126)
Anion gap: 10 (ref 5–15)
BILIRUBIN TOTAL: 0.4 mg/dL (ref 0.3–1.2)
BUN: 21 mg/dL — AB (ref 6–20)
CO2: 24 mmol/L (ref 22–32)
CREATININE: 1.52 mg/dL — AB (ref 0.44–1.00)
Calcium: 9.4 mg/dL (ref 8.9–10.3)
Chloride: 102 mmol/L (ref 101–111)
GFR, EST AFRICAN AMERICAN: 35 mL/min — AB (ref >60)
GFR, EST NON AFRICAN AMERICAN: 30 mL/min — AB (ref >60)
Glucose, Bld: 221 mg/dL — ABNORMAL HIGH (ref 65–99)
POTASSIUM: 4.2 mmol/L (ref 3.5–5.1)
SODIUM: 136 mmol/L (ref 135–145)
TOTAL PROTEIN: 7.5 g/dL (ref 6.5–8.1)

## 2015-04-24 LAB — I-STAT CHEM 8, ED
BUN: 24 mg/dL — ABNORMAL HIGH (ref 6–20)
CHLORIDE: 101 mmol/L (ref 101–111)
CREATININE: 1.4 mg/dL — AB (ref 0.44–1.00)
Calcium, Ion: 1.15 mmol/L (ref 1.13–1.30)
GLUCOSE: 224 mg/dL — AB (ref 65–99)
HCT: 31 % — ABNORMAL LOW (ref 36.0–46.0)
Hemoglobin: 10.5 g/dL — ABNORMAL LOW (ref 12.0–15.0)
POTASSIUM: 4.2 mmol/L (ref 3.5–5.1)
Sodium: 137 mmol/L (ref 135–145)
TCO2: 23 mmol/L (ref 0–100)

## 2015-04-24 LAB — PROTIME-INR
INR: 1.17 (ref 0.00–1.49)
Prothrombin Time: 15.1 seconds (ref 11.6–15.2)

## 2015-04-24 LAB — CBC
HEMATOCRIT: 30.3 % — AB (ref 36.0–46.0)
HEMOGLOBIN: 8.9 g/dL — AB (ref 12.0–15.0)
MCH: 24.1 pg — ABNORMAL LOW (ref 26.0–34.0)
MCHC: 29.4 g/dL — ABNORMAL LOW (ref 30.0–36.0)
MCV: 81.9 fL (ref 78.0–100.0)
Platelets: 200 10*3/uL (ref 150–400)
RBC: 3.7 MIL/uL — ABNORMAL LOW (ref 3.87–5.11)
RDW: 21.3 % — ABNORMAL HIGH (ref 11.5–15.5)
WBC: 15.8 10*3/uL — AB (ref 4.0–10.5)

## 2015-04-24 LAB — ETHANOL: Alcohol, Ethyl (B): <5 mg/dL (ref <5)

## 2015-04-24 LAB — I-STAT TROPONIN, ED: TROPONIN I, POC: 0.04 ng/mL (ref 0.00–0.08)

## 2015-04-24 LAB — APTT: APTT: 24 s (ref 24–37)

## 2015-04-24 MED ORDER — PANTOPRAZOLE SODIUM 40 MG PO TBEC
40.0000 mg | DELAYED_RELEASE_TABLET | Freq: Every day | ORAL | Status: DC
  Administered 2015-04-25 – 2015-04-26 (×2): 40 mg via ORAL
  Filled 2015-04-24 (×2): qty 1

## 2015-04-24 MED ORDER — TRAMADOL HCL 50 MG PO TABS: 50.0000 mg | ORAL_TABLET | Freq: Two times a day (BID) | ORAL | Status: DC | PRN

## 2015-04-24 MED ORDER — INSULIN GLARGINE 100 UNIT/ML ~~LOC~~ SOLN
28.0000 [IU] | Freq: Two times a day (BID) | SUBCUTANEOUS | Status: DC
  Administered 2015-04-24 – 2015-04-26 (×4): 28 [IU] via SUBCUTANEOUS
  Filled 2015-04-24 (×5): qty 0.28

## 2015-04-24 MED ORDER — SUCRALFATE 1 G PO TABS
1.0000 g | ORAL_TABLET | Freq: Every day | ORAL | Status: DC
  Administered 2015-04-24 – 2015-04-25 (×2): 1 g via ORAL
  Filled 2015-04-24 (×2): qty 1

## 2015-04-24 MED ORDER — ALBUTEROL SULFATE HFA 108 (90 BASE) MCG/ACT IN AERS: 2.0000 | INHALATION_SPRAY | Freq: Four times a day (QID) | RESPIRATORY_TRACT | Status: DC | PRN

## 2015-04-24 MED ORDER — ESCITALOPRAM OXALATE 10 MG PO TABS
20.0000 mg | ORAL_TABLET | Freq: Every day | ORAL | Status: DC
  Administered 2015-04-25 – 2015-04-26 (×2): 20 mg via ORAL
  Filled 2015-04-24 (×2): qty 2

## 2015-04-24 MED ORDER — SENNOSIDES-DOCUSATE SODIUM 8.6-50 MG PO TABS: 1.0000 | ORAL_TABLET | Freq: Every evening | ORAL | Status: DC | PRN

## 2015-04-24 MED ORDER — FUROSEMIDE 40 MG PO TABS
40.0000 mg | ORAL_TABLET | Freq: Every day | ORAL | Status: DC
  Administered 2015-04-25 – 2015-04-26 (×2): 40 mg via ORAL
  Filled 2015-04-24 (×2): qty 1

## 2015-04-24 MED ORDER — TIOTROPIUM BROMIDE MONOHYDRATE 18 MCG IN CAPS
18.0000 ug | ORAL_CAPSULE | Freq: Every day | RESPIRATORY_TRACT | Status: DC
  Administered 2015-04-25: 18 ug via RESPIRATORY_TRACT
  Filled 2015-04-24: qty 5

## 2015-04-24 MED ORDER — FERROUS SULFATE 325 (65 FE) MG PO TABS
325.0000 mg | ORAL_TABLET | Freq: Every day | ORAL | Status: DC
  Administered 2015-04-25 – 2015-04-26 (×2): 325 mg via ORAL
  Filled 2015-04-24 (×2): qty 1

## 2015-04-24 MED ORDER — INSULIN GLARGINE 100 UNIT/ML ~~LOC~~ SOLN
28.0000 [IU] | Freq: Two times a day (BID) | SUBCUTANEOUS | Status: DC
  Filled 2015-04-24: qty 0.3

## 2015-04-24 MED ORDER — STROKE: EARLY STAGES OF RECOVERY BOOK
Freq: Once | Status: DC
  Filled 2015-04-24: qty 1

## 2015-04-24 MED ORDER — ENOXAPARIN SODIUM 40 MG/0.4ML ~~LOC~~ SOLN
40.0000 mg | SUBCUTANEOUS | Status: DC
  Administered 2015-04-24: 40 mg via SUBCUTANEOUS
  Filled 2015-04-24: qty 0.4

## 2015-04-24 MED ORDER — LISINOPRIL 5 MG PO TABS
5.0000 mg | ORAL_TABLET | Freq: Every day | ORAL | Status: DC
  Administered 2015-04-25: 5 mg via ORAL
  Filled 2015-04-24 (×2): qty 1

## 2015-04-24 MED ORDER — ACETAMINOPHEN 325 MG PO TABS
650.0000 mg | ORAL_TABLET | Freq: Three times a day (TID) | ORAL | Status: DC
  Administered 2015-04-24 – 2015-04-26 (×6): 650 mg via ORAL
  Filled 2015-04-24 (×6): qty 2

## 2015-04-24 MED ORDER — SIMVASTATIN 5 MG PO TABS
5.0000 mg | ORAL_TABLET | Freq: Every day | ORAL | Status: DC
  Administered 2015-04-24 – 2015-04-25 (×2): 5 mg via ORAL
  Filled 2015-04-24 (×2): qty 1

## 2015-04-24 MED ORDER — LEVOTHYROXINE SODIUM 25 MCG PO TABS
25.0000 ug | ORAL_TABLET | Freq: Every evening | ORAL | Status: DC
  Administered 2015-04-25 – 2015-04-26 (×2): 25 ug via ORAL
  Filled 2015-04-24 (×2): qty 1

## 2015-04-24 MED ORDER — MOMETASONE FURO-FORMOTEROL FUM 100-5 MCG/ACT IN AERO
2.0000 | INHALATION_SPRAY | Freq: Two times a day (BID) | RESPIRATORY_TRACT | Status: DC
  Administered 2015-04-25 – 2015-04-26 (×3): 2 via RESPIRATORY_TRACT
  Filled 2015-04-24: qty 8.8

## 2015-04-24 MED ORDER — DONEPEZIL HCL 5 MG PO TABS
5.0000 mg | ORAL_TABLET | Freq: Every day | ORAL | Status: DC
  Administered 2015-04-24 – 2015-04-25 (×2): 5 mg via ORAL
  Filled 2015-04-24 (×2): qty 1

## 2015-04-24 MED ORDER — ALBUTEROL SULFATE (2.5 MG/3ML) 0.083% IN NEBU: 2.5000 mg | INHALATION_SOLUTION | Freq: Four times a day (QID) | RESPIRATORY_TRACT | Status: DC | PRN

## 2015-04-24 NOTE — ED Notes (Signed)
[facility] - at 1545 staff was in patient room and noted patient at that time to become suddenly confused with slurred speech and left facial droop. Pt is alert to self. Pt has hx of CHF and diabetes. Recent GI bleed and was taken off of coumadin. Last BP 180/100, HR 92 and regular, 95% on RA.

## 2015-04-24 NOTE — ED Provider Notes (Signed)
CSN: [id number]     Arrival date & time 04/24/15  1633 History   None    Chief Complaint  Patient presents with  . Code Stroke      HPI  Expand All Collapse All   Pt to ER from [facility] nursing facility - at 1545 staff was in patient room and noted patient at that time to become suddenly confused with slurred speech and left facial droop. Pt is alert to self. Pt has hx of CHF and diabetes. Recent GI bleed and was taken off of coumadin. Last BP 180/100, HR 92 and regular, 95% on RA.        Past Medical History  Diagnosis Date  . Stroke (HCC)   . Neuropathy (HCC)   . Swelling   . General weakness   . CHF (congestive heart failure) (HCC)   . CPAP (continuous positive airway pressure) dependence   . Diabetes mellitus without complication (HCC)   . Allergic rhinitis   . Hyperlipidemia   . Venous insufficiency   . Urinary incontinence   . Anxiety   . Depression   . Osteoarthritis   . COPD (chronic obstructive pulmonary disease) (HCC)   . DVT (deep venous thrombosis) (HCC)   . PE (pulmonary embolism)    Past Surgical History  Procedure Laterality Date  . Cholecystectomy    . Appendectomy    . Cataract extraction, bilateral    . Esophagogastroduodenoscopy (egd) with propofol N/A 04/05/2015    Procedure: ESOPHAGOGASTRODUODENOSCOPY (EGD) WITH PROPOFOL;  Surgeon: Graylin Shiver, MD;  Location: [facility];  Service: Endoscopy;  Laterality: N/A;   History reviewed. No pertinent family history. Social History  Substance Use Topics  . Smoking status: Former Games developer  . Smokeless tobacco: Never Used     Comment: quit 1991  . Alcohol Use: No   OB History    No data available     Review of Systems  All other systems reviewed and are negative.     Allergies  Pollen extract  Home Medications   Prior to Admission medications   Medication Sig Start Date End Date Taking? Authorizing Provider  acetaminophen (TYLENOL) 325 MG tablet Take 650 mg by mouth 3 (three) times  daily.   Yes Historical Provider, MD  albuterol (PROVENTIL HFA;VENTOLIN HFA) 108 (90 BASE) MCG/ACT inhaler Inhale 2 puffs into the lungs every 6 (six) hours as needed for wheezing or shortness of breath.   Yes Historical Provider, MD  cholecalciferol (VITAMIN D) 1000 UNITS tablet Take 1,000 Units by mouth every morning.    Yes Historical Provider, MD  Cranberry 450 MG TABS Take 1 tablet by mouth 2 (two) times daily.   Yes Historical Provider, MD  diclofenac sodium (VOLTAREN) 1 % GEL Apply 2 g topically 2 (two) times daily.   Yes Historical Provider, MD  donepezil (ARICEPT) 5 MG tablet Take 1 tablet (5 mg total) by mouth at bedtime. 06/07/13  Yes Rebecca S Tat, DO  escitalopram (LEXAPRO) 20 MG tablet Take 20 mg by mouth daily with breakfast.   Yes Historical Provider, MD  ferrous sulfate 325 (65 FE) MG tablet Take 325 mg by mouth daily with breakfast.   Yes Historical Provider, MD  fexofenadine (ALLEGRA) 60 MG tablet Take 60 mg by mouth daily with breakfast.   Yes Historical Provider, MD  fluticasone (FLONASE) 50 MCG/ACT nasal spray Place 1 spray into both nostrils at bedtime.   Yes Historical Provider, MD  Fluticasone-Salmeterol (ADVAIR) 250-50 MCG/DOSE AEPB Inhale 1 puff into  the lungs 2 (two) times daily. Rinse and spit after each use.   Yes Historical Provider, MD  furosemide (LASIX) 40 MG tablet Take 1 tablet (40 mg total) by mouth daily with breakfast. 04/09/15  Yes Rodolph Bong, MD  insulin lispro (HUMALOG KWIKPEN) 100 UNIT/ML KiwkPen Inject 20 Units into the skin 3 (three) times daily.   Yes Historical Provider, MD  levothyroxine (SYNTHROID, LEVOTHROID) 25 MCG tablet Take 25 mcg by mouth every evening.    Yes Historical Provider, MD  lisinopril (PRINIVIL,ZESTRIL) 5 MG tablet Take 1 tablet (5 mg total) by mouth daily with breakfast. Resume on 04/12/2015 04/12/15  Yes Rodolph Bong, MD  omeprazole (PRILOSEC) 40 MG capsule Take 40 mg by mouth 2 (two) times daily.   Yes Historical Provider,  MD  simvastatin (ZOCOR) 5 MG tablet Take 5 mg by mouth at bedtime.   Yes Historical Provider, MD  sucralfate (CARAFATE) 1 G tablet Take 1 g by mouth at bedtime.   Yes Historical Provider, MD  tiotropium (SPIRIVA) 18 MCG inhalation capsule Place 18 mcg into inhaler and inhale at bedtime.   Yes Historical Provider, MD  traMADol (ULTRAM) 50 MG tablet Take 50 mg by mouth 2 (two) times daily as needed (for pain).    Yes Historical Provider, MD  apixaban (ELIQUIS) 5 MG TABS tablet Take 1 tablet (5 mg total) by mouth 2 (two) times daily. 04/26/15   Marinda Elk, MD  insulin glargine (LANTUS) 100 UNIT/ML injection Inject 0.35 mLs (35 Units total) into the skin 2 (two) times daily. Takes 30 units in the morning and 28 units at bedtime 04/26/15   Marinda Elk, MD   BP 137/49 mmHg  Pulse 87  Temp(Src) 98.5 F (36.9 C) (Oral)  Resp 20  Ht 5' 6.5" (1.689 m)  Wt 260 lb 5.8 oz (118.1 kg)  BMI 41.40 kg/m2  SpO2 94% Physical Exam  Constitutional: She appears well-developed and well-nourished. No distress.  HENT:  Head: Normocephalic and atraumatic.  Eyes: Pupils are equal, round, and reactive to light.  Neck: Normal range of motion.  Cardiovascular: Normal rate and intact distal pulses.   Pulmonary/Chest: No respiratory distress.  Abdominal: Normal appearance. She exhibits no distension. There is no tenderness. There is no rebound.  Musculoskeletal: Normal range of motion.  Neurological: She is alert. A cranial nerve deficit is present.  Skin: Skin is warm and dry. No rash noted.  Psychiatric: She has a normal mood and affect. Her behavior is normal.  Nursing note and vitals reviewed.   ED Course  Procedures (including critical care time) CRITICAL CARE Performed by: Nelva Nay L Total critical care time: 30 minutes Critical care time was exclusive of separately billable procedures and treating other patients. Critical care was necessary to treat or prevent imminent or  life-threatening deterioration. Critical care was time spent personally by me on the following activities: development of treatment plan with patient and/or surrogate as well as nursing, discussions with consultants, evaluation of patient's response to treatment, examination of patient, obtaining history from patient or surrogate, ordering and performing treatments and interventions, ordering and review of laboratory studies, ordering and review of radiographic studies, pulse oximetry and re-evaluation of patient's condition.  Labs Review Labs Reviewed  CBC - Abnormal; Notable for the following:    WBC 15.8 (*)    RBC 3.70 (*)    Hemoglobin 8.9 (*)    HCT 30.3 (*)    MCH 24.1 (*)    MCHC 29.4 (*)  RDW 21.3 (*)    All other components within normal limits  DIFFERENTIAL - Abnormal; Notable for the following:    Neutro Abs 12.6 (*)    Monocytes Absolute 1.4 (*)    All other components within normal limits  COMPREHENSIVE METABOLIC PANEL - Abnormal; Notable for the following:    Glucose, Bld 221 (*)    BUN 21 (*)    Creatinine, Ser 1.52 (*)    Albumin 3.4 (*)    AST 14 (*)    ALT 11 (*)    GFR calc non Af Amer 30 (*)    GFR calc Af Amer 35 (*)    All other components within normal limits  HEMOGLOBIN A1C - Abnormal; Notable for the following:    Hgb A1c MFr Bld 7.9 (*)    All other components within normal limits  LIPID PANEL - Abnormal; Notable for the following:    Triglycerides 227 (*)    HDL 30 (*)    VLDL 45 (*)    All other components within normal limits  GLUCOSE, CAPILLARY - Abnormal; Notable for the following:    Glucose-Capillary 217 (*)    All other components within normal limits  GLUCOSE, CAPILLARY - Abnormal; Notable for the following:    Glucose-Capillary 192 (*)    All other components within normal limits  GLUCOSE, CAPILLARY - Abnormal; Notable for the following:    Glucose-Capillary 227 (*)    All other components within normal limits  BASIC METABOLIC PANEL  - Abnormal; Notable for the following:    Glucose, Bld 138 (*)    BUN 22 (*)    Creatinine, Ser 1.26 (*)    GFR calc non Af Amer 38 (*)    GFR calc Af Amer 44 (*)    All other components within normal limits  GLUCOSE, CAPILLARY - Abnormal; Notable for the following:    Glucose-Capillary 190 (*)    All other components within normal limits  GLUCOSE, CAPILLARY - Abnormal; Notable for the following:    Glucose-Capillary 174 (*)    All other components within normal limits  GLUCOSE, CAPILLARY - Abnormal; Notable for the following:    Glucose-Capillary 137 (*)    All other components within normal limits  GLUCOSE, CAPILLARY - Abnormal; Notable for the following:    Glucose-Capillary 171 (*)    All other components within normal limits  GLUCOSE, CAPILLARY - Abnormal; Notable for the following:    Glucose-Capillary 273 (*)    All other components within normal limits  I-STAT CHEM 8, ED - Abnormal; Notable for the following:    BUN 24 (*)    Creatinine, Ser 1.40 (*)    Glucose, Bld 224 (*)    Hemoglobin 10.5 (*)    HCT 31.0 (*)    All other components within normal limits  ETHANOL  PROTIME-INR  APTT  I-STAT TROPOININ, ED    Imaging Review No results found. I have personally reviewed and evaluated these images and lab results as part of my medical decision-making.   EKG Interpretation   Date/Time:  Tuesday April 24 2015 16:51:49 EST Ventricular Rate:  88 PR Interval:  178 QRS Duration: 95 QT Interval:  377 QTC Calculation: 456 R Axis:   -33 Text Interpretation:  Sinus rhythm Left axis deviation Abnormal R-wave  progression, early transition Nonspecific T abnormalities, lateral leads  Confirmed by Orel Cooler  MD, Keslee Harrington ([id number]) on 04/24/2015 5:31:42 PM      MDM   Final diagnoses:  Transient  cerebral ischemia, unspecified transient cerebral ischemia type        Nelva Nay, MD 05/10/15 2351

## 2015-04-24 NOTE — Code Documentation (Signed)
Patient from facility.  Staff at bedside at 1545 noted patient to become acutely confused with slurred speech and facial droop.   Code Stroke was called at 1616.  Patient arrived via EMS at 1633.  Stat labs and head CT done.  Patient with recent history of GI bleed, per mar is currently not taking her coumadin.  NIHSS 7, bilat leg weakness (2), missed the month was able to state age, mild facial droop and mild dysarthria.  Patient remains in TPA window until 2015, q15 min VS and q 30 min neuro checks until 2015.   RN to call if patient worsens.  Dr Roseanne RenoStewart at bedside to assess patient.

## 2015-04-24 NOTE — ED Notes (Signed)
Per pt son, pt face looks normal to him, he states "her mouth has looked like that since she got dentures."

## 2015-04-24 NOTE — H&P (Addendum)
[facility] History and Physical  [name]:[id number] DOB: 1927/09/28 DOA: 04/24/2015   PCP: Vista Mink, PA-C    Chief Complaint: sent from AL for confusion  HPI: Courtney Montgomery is a 81 y.o. female with h/o DVT, PE s/p IVC filter, DM on insulin, CHF, dementia, CVA who is sent from the ALF for confusion, slurred speech and a left facial droop starting around 3:45 this afternoon. Duration of symptoms undetermined. The patient is oriented at this time but cannot recall coming in to the hospital. Currently asymptomatic. She has a h/o DVT and PE and still has an IVC filter. Son is at bedside and was not aware that her Coumadin was stopped recently. She was admitted to [facility] hospital for anemia and occult positive stools. An EGD was unrevealing. Colonoscopy could be done as she could not tolerate the prep. She received 2 U PRBC at that time and was discharged on 11/5.   ROS  General: The patient denies anorexia, fever, weight loss Cardiac: Denies chest pain, syncope, palpitations, pedal edema  Respiratory: Denies cough, shortness of breath, wheezing GI: Denies severe indigestion/heartburn, abdominal pain, nausea, vomiting, diarrhea and constipation GU: Denies hematuria, incontinence, dysuria  Musculoskeletal: Denies arthritis  Skin: Denies suspicious skin lesions Neurologic: Denies focal weakness or numbness, change in vision Psychiatry: + depression no anxiety. Hematologic: no easy bruising or bleeding  All other systems reviewed and found to be negative.  Past Medical History  Diagnosis Date  . Stroke (HCC)   . Neuropathy (HCC)   . Swelling   . General weakness   . CHF (congestive heart failure) (HCC)   . CPAP (continuous positive airway pressure) dependence   . Diabetes mellitus without complication (HCC)   . Allergic rhinitis   . Hyperlipidemia   . Venous insufficiency   . Urinary incontinence   . Anxiety   . Depression   . Osteoarthritis   . COPD (chronic  obstructive pulmonary disease) (HCC)   . DVT (deep venous thrombosis) (HCC)   . PE (pulmonary embolism)     Past Surgical History  Procedure Laterality Date  . Cholecystectomy    . Appendectomy    . Cataract extraction, bilateral    . Esophagogastroduodenoscopy (egd) with propofol N/A 04/05/2015    Procedure: ESOPHAGOGASTRODUODENOSCOPY (EGD) WITH PROPOFOL;  Surgeon: Graylin Shiver, MD;  Location: [facility];  Service: Endoscopy;  Laterality: N/A;    Social History: stopped smoking 20 + yrs ago, does not drink alcohol Lives at [facility] assisted living    Allergies  Allergen Reactions  . Pollen Extract Other (See Comments)    unknown    Family history:   Father had blood clots    Prior to Admission medications   Medication Sig Start Date End Date Taking? Authorizing Provider  acetaminophen (TYLENOL) 325 MG tablet Take 650 mg by mouth 3 (three) times daily.   Yes Historical Provider, MD  albuterol (PROVENTIL HFA;VENTOLIN HFA) 108 (90 BASE) MCG/ACT inhaler Inhale 2 puffs into the lungs every 6 (six) hours as needed for wheezing or shortness of breath.   Yes Historical Provider, MD  cholecalciferol (VITAMIN D) 1000 UNITS tablet Take 1,000 Units by mouth every morning.    Yes Historical Provider, MD  Cranberry 450 MG TABS Take 1 tablet by mouth 2 (two) times daily.   Yes Historical Provider, MD  diclofenac sodium (VOLTAREN) 1 % GEL Apply 2 g topically 2 (two) times daily.   Yes Historical Provider, MD  donepezil (ARICEPT) 5 MG tablet Take  1 tablet (5 mg total) by mouth at bedtime. 06/07/13  Yes Rebecca S Tat, DO  escitalopram (LEXAPRO) 20 MG tablet Take 20 mg by mouth daily with breakfast.   Yes Historical Provider, MD  ferrous sulfate 325 (65 FE) MG tablet Take 325 mg by mouth daily with breakfast.   Yes Historical Provider, MD  fexofenadine (ALLEGRA) 60 MG tablet Take 60 mg by mouth daily with breakfast.   Yes Historical Provider, MD  fluticasone (FLONASE) 50 MCG/ACT nasal spray Place  1 spray into both nostrils at bedtime.   Yes Historical Provider, MD  Fluticasone-Salmeterol (ADVAIR) 250-50 MCG/DOSE AEPB Inhale 1 puff into the lungs 2 (two) times daily. Rinse and spit after each use.   Yes Historical Provider, MD  furosemide (LASIX) 40 MG tablet Take 1 tablet (40 mg total) by mouth daily with breakfast. 04/09/15  Yes Rodolph Bong, MD  insulin glargine (LANTUS) 100 UNIT/ML injection Inject 28-30 Units into the skin 2 (two) times daily. Takes 30 units in the morning and 28 units at bedtime   Yes Historical Provider, MD  insulin lispro (HUMALOG KWIKPEN) 100 UNIT/ML KiwkPen Inject 20 Units into the skin 3 (three) times daily.   Yes Historical Provider, MD  levothyroxine (SYNTHROID, LEVOTHROID) 25 MCG tablet Take 25 mcg by mouth every evening.    Yes Historical Provider, MD  lisinopril (PRINIVIL,ZESTRIL) 5 MG tablet Take 1 tablet (5 mg total) by mouth daily with breakfast. Resume on 04/12/2015 04/12/15  Yes Rodolph Bong, MD  nystatin (MYCOSTATIN/NYSTOP) 100000 UNIT/GM POWD Apply 1 g topically daily with breakfast. Apply under breasts   Yes Historical Provider, MD  omeprazole (PRILOSEC) 40 MG capsule Take 40 mg by mouth 2 (two) times daily.   Yes Historical Provider, MD  simvastatin (ZOCOR) 5 MG tablet Take 5 mg by mouth at bedtime.   Yes Historical Provider, MD  sucralfate (CARAFATE) 1 G tablet Take 1 g by mouth at bedtime.   Yes Historical Provider, MD  tiotropium (SPIRIVA) 18 MCG inhalation capsule Place 18 mcg into inhaler and inhale at bedtime.   Yes Historical Provider, MD  traMADol (ULTRAM) 50 MG tablet Take 50 mg by mouth 2 (two) times daily as needed (for pain).    Yes Historical Provider, MD     Physical Exam: Filed Vitals:   04/24/15 1730 04/24/15 1745 04/24/15 1800 04/24/15 1815  BP: 133/64 118/94 131/87 132/57  Pulse: 89 89 89 90  Temp:      TempSrc:      Resp: SpO2: 100% 97% 95% 96%     General: Alert, oriented to place, person and month  (not day), no distress HEENT: Normocephalic and Atraumatic, Mucous membranes pink                PERRLA; EOM intact; No scleral icterus,                 Nares: Patent, Oropharynx: Clear, Fair Dentition                 Neck: FROM, no cervical lymphadenopathy, thyromegaly, carotid bruit or JVD;  Breasts: deferred CHEST WALL: No tenderness  CHEST: Normal respiration, clear to auscultation bilaterally  HEART: Regular rate and rhythm; no murmurs rubs or gallops  BACK: No kyphosis or scoliosis; no CVA tenderness  GI: Positive Bowel Sounds, soft, non-tender; no masses, no organomegaly Rectal Exam: deferred MSK: No cyanosis, clubbing, or edema Genitalia: not examined  SKIN:  no rash or ulceration  CNS: Alert and Oriented x 4, Nonfocal exam, CN 2-12 intact  Labs on Admission:  Basic Metabolic Panel:  Recent Labs Lab 04/24/15 1655 04/24/15 1711  NA 136 137  K 4.2 4.2  CL 102 101  CO2 24  --   GLUCOSE 221* 224*  BUN 21* 24*  CREATININE 1.52* 1.40*  CALCIUM 9.4  --    Liver Function Tests:  Recent Labs Lab 04/24/15 1655  AST 14*  ALT 11*  ALKPHOS 56  BILITOT 0.4  PROT 7.5  ALBUMIN 3.4*   No results for input(s): LIPASE, AMYLASE in the last 168 hours. No results for input(s): AMMONIA in the last 168 hours. CBC:  Recent Labs Lab 04/24/15 1655 04/24/15 1711  WBC 15.8*  --   NEUTROABS 12.6*  --   HGB 8.9* 10.5*  HCT 30.3* 31.0*  MCV 81.9  --   PLT 200  --    Cardiac Enzymes: No results for input(s): CKTOTAL, CKMB, CKMBINDEX, TROPONINI in the last 168 hours.  BNP (last 3 results) No results for input(s): BNP in the last 8760 hours.  ProBNP (last 3 results) No results for input(s): PROBNP in the last 8760 hours.  CBG: No results for input(s): GLUCAP in the last 168 hours.  Radiological Exams on Admission: Ct Head Wo Contrast  04/24/2015  ADDENDUM REPORT: 04/24/2015 17:05 ADDENDUM: These results were called by telephone at the time of interpretation on  04/24/2015 at 4:57 Pm to Dr. Roseanne RenoStewart , who verbally acknowledged these results. Electronically Signed   By: Signa Kellaylor  Stroud M.D.   On: 04/24/2015 17:05  04/24/2015  CLINICAL DATA:  Slurred speech.  Left side facial droop. EXAM: CT HEAD WITHOUT CONTRAST TECHNIQUE: Contiguous axial images were obtained from the base of the skull through the vertex without intravenous contrast. COMPARISON:  02/02/2013. FINDINGS: There is prominence of the sulci and ventricles consistent with brain atrophy. Diffuse low attenuation within the subcortical and periventricular white matter compatible with chronic small vessel ischemic disease. No evidence for acute cortical infarct, intracranial hemorrhage or mass. The paranasal sinuses are clear. The mastoid air cells are clear. IMPRESSION: 1. No acute intracranial abnormalities. 2. Chronic microvascular disease and brain atrophy. Electronically Signed: By: Signa Kellaylor  Stroud M.D. On: 04/24/2015 16:53    EKG: Independently reviewed. Sinus rhythm with LAD  Assessment/Plan Principal Problem:   TIA (transient ischemic attack) - f/u neuro w/u including MRI/MRA, carotid duplex, ECHO, A1c, Lipid panel - not on ASA at home- check stool heme occult and make further decision  Active Problems:   Dementia without behavioral disturbance - cont Aricept    Dyslipidemia associated with type 2 diabetes mellitus -c ont home dose of lantus- place on sliding scale insuiln   COPD - stable- cont inhalers  -Essential hypertension - cont Lisinopril    Hypothyroidism - cont synthroid     Anemia  - recently heme occult + - stable- check stool occult blood - cont Iron    Consulted:   Code Status: *DNR Family Communication: son  DVT Prophylaxis: Lovenox  Time spent: 3755  Rozelle Caudle, MD [facility]  If 7PM-7AM, please contact night-coverage [email or web address] 04/24/2015, 6:34 PM

## 2015-04-24 NOTE — Consult Note (Signed)
Admission H&P    Chief Complaint: New onset left facial weakness and slurred speech.  HPI: Courtney Montgomery is an 81 y.o. female with a history of hypertension, previous stroke, diabetes mellitus and hyperlipidemia as well as dementia, to the emergency room and code stroke status after developing left facial weakness and speech. She was last known well at 1545 today. She was also noted to be somewhat more confused than usual. CT scan of the head showed no acute intracranial abnormality. Patient was hospitalized on 04/03/2015 4 acute GI bleed. She was on Coumadin at the time for history of DVT. Comminuted was discontinued. She is currently on no anticoagulant medication. INR today was 1.17. NIH stroke score was 7, including moderately severe weakness proximally and distally of both lower extremities. She was not deemed a candidate for TPA because of her recent gastrointestinal hemorrhage as well as mild deficits considered to be acute.  LSN: [id number] on 04/24/2015 tPA Given: No: As stated above mRankin:  Past Medical History  Diagnosis Date  . Stroke ([place])   . Neuropathy ([place])   . Swelling   . General weakness   . CHF (congestive heart failure) ([place])   . CPAP (continuous positive airway pressure) dependence   . Diabetes mellitus without complication ([place])   . Allergic rhinitis   . Hyperlipidemia   . Venous insufficiency   . Urinary incontinence   . Anxiety   . Depression   . Osteoarthritis   . COPD (chronic obstructive pulmonary disease) ([place])   . DVT (deep venous thrombosis) ([place])   . PE (pulmonary embolism)     Past Surgical History  Procedure Laterality Date  . Cholecystectomy    . Appendectomy    . Cataract extraction, bilateral    . Esophagogastroduodenoscopy (egd) with propofol N/A 04/05/2015    Procedure: ESOPHAGOGASTRODUODENOSCOPY (EGD) WITH PROPOFOL;  Surgeon: Wonda Horner, MD;  Location: [facility];  Service: Endoscopy;  Laterality: N/A;    History reviewed. No pertinent  family history. Social History:  reports that she has quit smoking. She has never used smokeless tobacco. She reports that she does not drink alcohol or use illicit drugs.  Allergies:  Allergies  Allergen Reactions  . Pollen Extract Other (See Comments)    unknown    Medications: Patient's preadmission medications were reviewed by me.  ROS: Unavailable due to issues history of dementia and mild confusion.  Physical Examination: Blood pressure 133/64, pulse 89, temperature 98.3 F (36.8 C), temperature source Oral, resp. rate 21, SpO2 100 %.  HEENT-  Normocephalic, no lesions, without obvious abnormality.  Normal external eye and conjunctiva.  Normal TM's bilaterally.  Normal auditory canals and external ears. Normal external nose, mucus membranes and septum.  Normal pharynx. Neck supple with no masses, nodes, nodules or enlargement. Cardiovascular - regular rate and rhythm, S1, S2 normal, no murmur, click, rub or gallop Lungs - chest clear, no wheezing, rales, normal symmetric air entry Abdomen - soft, non-tender; bowel sounds normal; no masses,  no organomegaly Extremities - moderate edema of legs and feet, left greater than right  Neurologic Examination: Mental Status: Alert, slightly disoriented to time, no acute distress.  Speech slightly slurred without evidence of aphasia. Able to follow commands without difficulty. Cranial Nerves: II-no field defect to confrontation; finger counting unreliable due to perseveration. III/IV/VI-Pupils were equal and reacted. Extraocular movements were full and conjugate.    V/VII-no facial numbness; mild left lower facial weakness. VIII-normal. X-mild dysarthria. XI: trapezius strength/neck flexion strength normal bilaterally XII-midline  tongue extension with normal strength. Motor: No drift of upper extremities; severe weakness of her extremities proximally and distally. Resting tremor of her chin noted. Sensory: Normal throughout. Deep  Tendon Reflexes: Absent throughout. Plantars: Mute bilaterally Cerebellar: Normal finger-to-nose testing septal mild intention tremor bilaterally. Carotid auscultation: Normal  Results for orders placed or performed during the hospital encounter of 04/24/15 (from the past 48 hour(s))  I-stat troponin, ED (not at [facility], [facility])     Status: None   Collection Time: 04/24/15  4:54 PM  Result Value Ref Range   Troponin i, poc 0.04 0.00 - 0.08 ng/mL   Comment 3            Comment: Due to the release kinetics of cTnI, a negative result within the first hours of the onset of symptoms does not rule out myocardial infarction with certainty. If myocardial infarction is still suspected, repeat the test at appropriate intervals.   Protime-INR     Status: None   Collection Time: 04/24/15  4:55 PM  Result Value Ref Range   Prothrombin Time 15.1 11.6 - 15.2 seconds   INR 1.17 0.00 - 1.49  APTT     Status: None   Collection Time: 04/24/15  4:55 PM  Result Value Ref Range   aPTT 24 24 - 37 seconds  CBC     Status: Abnormal   Collection Time: 04/24/15  4:55 PM  Result Value Ref Range   WBC 15.8 (H) 4.0 - 10.5 K/uL   RBC 3.70 (L) 3.87 - 5.11 MIL/uL   Hemoglobin 8.9 (L) 12.0 - 15.0 g/dL   HCT 30.3 (L) 36.0 - 46.0 %   MCV 81.9 78.0 - 100.0 fL   MCH 24.1 (L) 26.0 - 34.0 pg   MCHC 29.4 (L) 30.0 - 36.0 g/dL   RDW 21.3 (H) 11.5 - 15.5 %   Platelets 200 150 - 400 K/uL  Differential     Status: Abnormal   Collection Time: 04/24/15  4:55 PM  Result Value Ref Range   Neutrophils Relative % 80 %   Lymphocytes Relative 10 %   Monocytes Relative 9 %   Eosinophils Relative 1 %   Basophils Relative 0 %   Neutro Abs 12.6 (H) 1.7 - 7.7 K/uL   Lymphs Abs 1.6 0.7 - 4.0 K/uL   Monocytes Absolute 1.4 (H) 0.1 - 1.0 K/uL   Eosinophils Absolute 0.2 0.0 - 0.7 K/uL   Basophils Absolute 0.0 0.0 - 0.1 K/uL   RBC Morphology POLYCHROMASIA PRESENT     Comment: ELLIPTOCYTES   Smear Review FEW PLATELET CLUMPS NOTED    Comprehensive metabolic panel     Status: Abnormal   Collection Time: 04/24/15  4:55 PM  Result Value Ref Range   Sodium 136 135 - 145 mmol/L   Potassium 4.2 3.5 - 5.1 mmol/L   Chloride 102 101 - 111 mmol/L   CO2 24 22 - 32 mmol/L   Glucose, Bld 221 (H) 65 - 99 mg/dL   BUN 21 (H) 6 - 20 mg/dL   Creatinine, Ser 1.52 (H) 0.44 - 1.00 mg/dL   Calcium 9.4 8.9 - 10.3 mg/dL   Total Protein 7.5 6.5 - 8.1 g/dL   Albumin 3.4 (L) 3.5 - 5.0 g/dL   AST 14 (L) 15 - 41 U/L   ALT 11 (L) 14 - 54 U/L   Alkaline Phosphatase 56 38 - 126 U/L   Total Bilirubin 0.4 0.3 - 1.2 mg/dL   GFR calc non Af Wyvonnia Lora  30 (L) >60 mL/min   GFR calc Af Amer 35 (L) >60 mL/min    Comment: (NOTE) The eGFR has been calculated using the CKD EPI equation. This calculation has not been validated in all clinical situations. eGFR's persistently <60 mL/min signify possible Chronic Kidney Disease.    Anion gap 10 5 - 15  I-Stat Chem 8, ED  (not at [facility], [facility])     Status: Abnormal   Collection Time: 04/24/15  5:11 PM  Result Value Ref Range   Sodium 137 135 - 145 mmol/L   Potassium 4.2 3.5 - 5.1 mmol/L   Chloride 101 101 - 111 mmol/L   BUN 24 (H) 6 - 20 mg/dL   Creatinine, Ser 1.40 (H) 0.44 - 1.00 mg/dL   Glucose, Bld 224 (H) 65 - 99 mg/dL   Calcium, Ion 1.15 1.13 - 1.30 mmol/L   TCO2 23 0 - 100 mmol/L   Hemoglobin 10.5 (L) 12.0 - 15.0 g/dL   HCT 31.0 (L) 36.0 - 46.0 %   Ct Head Wo Contrast  04/24/2015  ADDENDUM REPORT: 04/24/2015 17:05 ADDENDUM: These results were called by telephone at the time of interpretation on 04/24/2015 at 4:57 Pm to Dr. Nicole Kindred , who verbally acknowledged these results. Electronically Signed   By: Kerby Moors M.D.   On: 04/24/2015 17:05  04/24/2015  CLINICAL DATA:  Slurred speech.  Left side facial droop. EXAM: CT HEAD WITHOUT CONTRAST TECHNIQUE: Contiguous axial images were obtained from the base of the skull through the vertex without intravenous contrast. COMPARISON:  02/02/2013. FINDINGS:  There is prominence of the sulci and ventricles consistent with brain atrophy. Diffuse low attenuation within the subcortical and periventricular white matter compatible with chronic small vessel ischemic disease. No evidence for acute cortical infarct, intracranial hemorrhage or mass. The paranasal sinuses are clear. The mastoid air cells are clear. IMPRESSION: 1. No acute intracranial abnormalities. 2. Chronic microvascular disease and brain atrophy. Electronically Signed: By: Kerby Moors M.D. On: 04/24/2015 16:53    Assessment: 81 y.o. female with multiple risk factors for stroke as well as history of previous stroke presenting with possible recurrent acute subcortical right cerebral infarction.  Stroke Risk Factors - diabetes mellitus, hyperlipidemia and hypertension  Plan: 1. HgbA1c, fasting lipid panel 2. MRI, MRA  of the brain without contrast 3. PT consult, OT consult, Speech consult 4. Echocardiogram 5. Carotid dopplers 6. Prophylactic therapy- to be determined 7. Risk factor modification 8. Telemetry monitoring  C.R. Nicole Kindred, MD [facility] [phone]  04/24/2015, 5:47 PM

## 2015-04-25 ENCOUNTER — Observation Stay (HOSPITAL_BASED_OUTPATIENT_CLINIC_OR_DEPARTMENT_OTHER): Payer: Medicare Other

## 2015-04-25 ENCOUNTER — Observation Stay (HOSPITAL_COMMUNITY): Payer: Medicare Other

## 2015-04-25 DIAGNOSIS — E1159 Type 2 diabetes mellitus with other circulatory complications: Secondary | ICD-10-CM | POA: Diagnosis present

## 2015-04-25 DIAGNOSIS — E1140 Type 2 diabetes mellitus with diabetic neuropathy, unspecified: Secondary | ICD-10-CM | POA: Diagnosis present

## 2015-04-25 DIAGNOSIS — I6529 Occlusion and stenosis of unspecified carotid artery: Secondary | ICD-10-CM

## 2015-04-25 DIAGNOSIS — I639 Cerebral infarction, unspecified: Secondary | ICD-10-CM | POA: Diagnosis not present

## 2015-04-25 DIAGNOSIS — E0840 Diabetes mellitus due to underlying condition with diabetic neuropathy, unspecified: Secondary | ICD-10-CM | POA: Diagnosis not present

## 2015-04-25 DIAGNOSIS — J449 Chronic obstructive pulmonary disease, unspecified: Secondary | ICD-10-CM | POA: Diagnosis present

## 2015-04-25 DIAGNOSIS — F0390 Unspecified dementia without behavioral disturbance: Secondary | ICD-10-CM | POA: Diagnosis present

## 2015-04-25 DIAGNOSIS — G459 Transient cerebral ischemic attack, unspecified: Secondary | ICD-10-CM

## 2015-04-25 DIAGNOSIS — I10 Essential (primary) hypertension: Secondary | ICD-10-CM | POA: Diagnosis not present

## 2015-04-25 DIAGNOSIS — I130 Hypertensive heart and chronic kidney disease with heart failure and stage 1 through stage 4 chronic kidney disease, or unspecified chronic kidney disease: Secondary | ICD-10-CM | POA: Diagnosis present

## 2015-04-25 DIAGNOSIS — J309 Allergic rhinitis, unspecified: Secondary | ICD-10-CM | POA: Diagnosis present

## 2015-04-25 DIAGNOSIS — E039 Hypothyroidism, unspecified: Secondary | ICD-10-CM | POA: Diagnosis present

## 2015-04-25 DIAGNOSIS — N183 Chronic kidney disease, stage 3 unspecified: Secondary | ICD-10-CM

## 2015-04-25 DIAGNOSIS — Z66 Do not resuscitate: Secondary | ICD-10-CM | POA: Diagnosis present

## 2015-04-25 DIAGNOSIS — I509 Heart failure, unspecified: Secondary | ICD-10-CM | POA: Diagnosis present

## 2015-04-25 DIAGNOSIS — Z6841 Body Mass Index (BMI) 40.0 and over, adult: Secondary | ICD-10-CM | POA: Diagnosis not present

## 2015-04-25 DIAGNOSIS — I872 Venous insufficiency (chronic) (peripheral): Secondary | ICD-10-CM | POA: Diagnosis present

## 2015-04-25 DIAGNOSIS — Z794 Long term (current) use of insulin: Secondary | ICD-10-CM | POA: Diagnosis not present

## 2015-04-25 DIAGNOSIS — R29707 NIHSS score 7: Secondary | ICD-10-CM | POA: Diagnosis present

## 2015-04-25 DIAGNOSIS — D631 Anemia in chronic kidney disease: Secondary | ICD-10-CM | POA: Diagnosis present

## 2015-04-25 DIAGNOSIS — R29810 Facial weakness: Secondary | ICD-10-CM | POA: Diagnosis present

## 2015-04-25 DIAGNOSIS — E785 Hyperlipidemia, unspecified: Secondary | ICD-10-CM | POA: Diagnosis present

## 2015-04-25 DIAGNOSIS — I6521 Occlusion and stenosis of right carotid artery: Secondary | ICD-10-CM | POA: Diagnosis not present

## 2015-04-25 DIAGNOSIS — R4781 Slurred speech: Secondary | ICD-10-CM | POA: Diagnosis present

## 2015-04-25 DIAGNOSIS — F419 Anxiety disorder, unspecified: Secondary | ICD-10-CM | POA: Diagnosis present

## 2015-04-25 DIAGNOSIS — Z86718 Personal history of other venous thrombosis and embolism: Secondary | ICD-10-CM | POA: Diagnosis not present

## 2015-04-25 DIAGNOSIS — I63411 Cerebral infarction due to embolism of right middle cerebral artery: Secondary | ICD-10-CM | POA: Diagnosis present

## 2015-04-25 DIAGNOSIS — E1122 Type 2 diabetes mellitus with diabetic chronic kidney disease: Secondary | ICD-10-CM | POA: Diagnosis present

## 2015-04-25 DIAGNOSIS — Z87891 Personal history of nicotine dependence: Secondary | ICD-10-CM | POA: Diagnosis not present

## 2015-04-25 DIAGNOSIS — I63421 Cerebral infarction due to embolism of right anterior cerebral artery: Secondary | ICD-10-CM | POA: Diagnosis present

## 2015-04-25 DIAGNOSIS — E1169 Type 2 diabetes mellitus with other specified complication: Secondary | ICD-10-CM | POA: Diagnosis not present

## 2015-04-25 DIAGNOSIS — N179 Acute kidney failure, unspecified: Secondary | ICD-10-CM | POA: Insufficient documentation

## 2015-04-25 DIAGNOSIS — Z86711 Personal history of pulmonary embolism: Secondary | ICD-10-CM | POA: Diagnosis not present

## 2015-04-25 DIAGNOSIS — F329 Major depressive disorder, single episode, unspecified: Secondary | ICD-10-CM | POA: Diagnosis present

## 2015-04-25 DIAGNOSIS — Z8673 Personal history of transient ischemic attack (TIA), and cerebral infarction without residual deficits: Secondary | ICD-10-CM | POA: Diagnosis not present

## 2015-04-25 LAB — LIPID PANEL
Cholesterol: 144 mg/dL (ref 0–200)
HDL: 30 mg/dL — AB (ref >40)
LDL CALC: 69 mg/dL (ref 0–99)
Total CHOL/HDL Ratio: 4.8 RATIO
Triglycerides: 227 mg/dL — ABNORMAL HIGH (ref <150)
VLDL: 45 mg/dL — ABNORMAL HIGH (ref 0–40)

## 2015-04-25 LAB — GLUCOSE, CAPILLARY
GLUCOSE-CAPILLARY: 190 mg/dL — AB (ref 65–99)
GLUCOSE-CAPILLARY: 174 mg/dL — AB (ref 65–99)
Glucose-Capillary: 192 mg/dL — ABNORMAL HIGH (ref 65–99)
Glucose-Capillary: 227 mg/dL — ABNORMAL HIGH (ref 65–99)

## 2015-04-25 MED ORDER — IOHEXOL 350 MG/ML SOLN
50.0000 mL | Freq: Once | INTRAVENOUS | Status: AC | PRN
  Administered 2015-04-25: 50 mL via INTRAVENOUS

## 2015-04-25 MED ORDER — INSULIN ASPART 100 UNIT/ML ~~LOC~~ SOLN
0.0000 [IU] | Freq: Three times a day (TID) | SUBCUTANEOUS | Status: DC
  Administered 2015-04-25: 3 [IU] via SUBCUTANEOUS
  Administered 2015-04-25 – 2015-04-26 (×2): 2 [IU] via SUBCUTANEOUS
  Administered 2015-04-26: 5 [IU] via SUBCUTANEOUS
  Administered 2015-04-26: 2 [IU] via SUBCUTANEOUS
  Administered 2015-04-26: 1 [IU] via SUBCUTANEOUS

## 2015-04-25 MED ORDER — INSULIN ASPART 100 UNIT/ML ~~LOC~~ SOLN: 0.0000 [IU] | Freq: Every day | SUBCUTANEOUS | Status: DC

## 2015-04-25 MED ORDER — APIXABAN 5 MG PO TABS
5.0000 mg | ORAL_TABLET | Freq: Two times a day (BID) | ORAL | Status: DC
  Administered 2015-04-25 – 2015-04-26 (×3): 5 mg via ORAL
  Filled 2015-04-25 (×3): qty 1

## 2015-04-25 MED ORDER — INSULIN ASPART 100 UNIT/ML ~~LOC~~ SOLN
3.0000 [IU] | Freq: Three times a day (TID) | SUBCUTANEOUS | Status: DC
  Administered 2015-04-25 (×2): 3 [IU] via SUBCUTANEOUS
  Administered 2015-04-26: 2 [IU] via SUBCUTANEOUS

## 2015-04-25 NOTE — Care Management Note (Signed)
Case Management Note  Patient Details  Name: Lequita AsalJean Kingdon MRN: [id number] Date of Birth: May 02, 1928  Subjective/Objective:   Patient admitted with CVA. [facility]                  Action/Plan: Await PT/OT recommendations. CM will continue to follow for discharge needs.   Expected Discharge Date:                  Expected Discharge Plan:     In-House Referral:     Discharge planning Services     Post Acute Care Choice:    Choice offered to:     DME Arranged:    DME Agency:     HH Arranged:    HH Agency:     Status of Service:  In process, will continue to follow  Medicare Important Message Given:    Date Medicare IM Given:    Medicare IM give by:    Date Additional Medicare IM Given:    Additional Medicare Important Message give by:     If discussed at Long Length of Stay Meetings, dates discussed:    Additional Comments:  Kermit BaloKelli F Minetta Krisher, RN 04/25/2015, 1:37 PM

## 2015-04-25 NOTE — Progress Notes (Signed)
ANTICOAGULATION CONSULT NOTE - Initial Consult  Pharmacy Consult for apixiban Indication: recurrent DVT  Allergies  Allergen Reactions  . Pollen Extract Other (See Comments)    unknown    Patient Measurements: Height: 5' 6.5" (168.9 cm) Weight: 260 lb 5.8 oz (118.1 kg) IBW/kg (Calculated) : 60.45   Vital Signs: Temp: 98 F (36.7 C) (11/23 1011) Temp Source: Oral (11/23 1011) BP: 131/46 mmHg (11/23 1011) Pulse Rate: 73 (11/23 1011)  Labs:  Recent Labs  04/24/15 1655 04/24/15 1711  HGB 8.9* 10.5*  HCT 30.3* 31.0*  PLT 200  --   APTT 24  --   LABPROT 15.1  --   INR 1.17  --   CREATININE 1.52* 1.40*    Estimated Creatinine Clearance: 38.7 mL/min (by C-G formula based on Cr of 1.4).   Medical History: Past Medical History  Diagnosis Date  . Stroke (HCC)   . Neuropathy (HCC)   . Swelling   . General weakness   . CHF (congestive heart failure) (HCC)   . CPAP (continuous positive airway pressure) dependence   . Diabetes mellitus without complication (HCC)   . Allergic rhinitis   . Hyperlipidemia   . Venous insufficiency   . Urinary incontinence   . Anxiety   . Depression   . Osteoarthritis   . COPD (chronic obstructive pulmonary disease) (HCC)   . DVT (deep venous thrombosis) (HCC)   . PE (pulmonary embolism)     Medications:  Prescriptions prior to admission  Medication Sig Dispense Refill Last Dose  . acetaminophen (TYLENOL) 325 MG tablet Take 650 mg by mouth 3 (three) times daily.   04/24/2015 at 1300  . albuterol (PROVENTIL HFA;VENTOLIN HFA) 108 (90 BASE) MCG/ACT inhaler Inhale 2 puffs into the lungs every 6 (six) hours as needed for wheezing or shortness of breath.   over 30 days  . cholecalciferol (VITAMIN D) 1000 UNITS tablet Take 1,000 Units by mouth every morning.    04/24/2015 at Unknown time  . Cranberry 450 MG TABS Take 1 tablet by mouth 2 (two) times daily.   04/24/2015 at Unknown time  . diclofenac sodium (VOLTAREN) 1 % GEL Apply 2 g  topically 2 (two) times daily.   04/24/2015 at Unknown time  . donepezil (ARICEPT) 5 MG tablet Take 1 tablet (5 mg total) by mouth at bedtime. 30 tablet 0 04/23/2015 at Unknown time  . escitalopram (LEXAPRO) 20 MG tablet Take 20 mg by mouth daily with breakfast.   04/24/2015 at Unknown time  . ferrous sulfate 325 (65 FE) MG tablet Take 325 mg by mouth daily with breakfast.   04/24/2015 at Unknown time  . fexofenadine (ALLEGRA) 60 MG tablet Take 60 mg by mouth daily with breakfast.   04/24/2015 at Unknown time  . fluticasone (FLONASE) 50 MCG/ACT nasal spray Place 1 spray into both nostrils at bedtime.   04/23/2015 at Unknown time  . Fluticasone-Salmeterol (ADVAIR) 250-50 MCG/DOSE AEPB Inhale 1 puff into the lungs 2 (two) times daily. Rinse and spit after each use.   04/24/2015 at Unknown time  . furosemide (LASIX) 40 MG tablet Take 1 tablet (40 mg total) by mouth daily with breakfast. 30 tablet  04/24/2015 at Unknown time  . insulin glargine (LANTUS) 100 UNIT/ML injection Inject 28-30 Units into the skin 2 (two) times daily. Takes 30 units in the morning and 28 units at bedtime   04/24/2015 at 7a  . insulin lispro (HUMALOG KWIKPEN) 100 UNIT/ML KiwkPen Inject 20 Units into the skin 3 (three)  times daily.   04/24/2015 at Unknown time  . levothyroxine (SYNTHROID, LEVOTHROID) 25 MCG tablet Take 25 mcg by mouth every evening.    04/23/2015 at Unknown time  . lisinopril (PRINIVIL,ZESTRIL) 5 MG tablet Take 1 tablet (5 mg total) by mouth daily with breakfast. Resume on 04/12/2015   04/24/2015 at Unknown time  . nystatin (MYCOSTATIN/NYSTOP) 100000 UNIT/GM POWD Apply 1 g topically daily with breakfast. Apply under breasts   04/24/2015 at Unknown time  . omeprazole (PRILOSEC) 40 MG capsule Take 40 mg by mouth 2 (two) times daily.   04/24/2015 at Unknown time  . simvastatin (ZOCOR) 5 MG tablet Take 5 mg by mouth at bedtime.   04/23/2015 at Unknown time  . sucralfate (CARAFATE) 1 G tablet Take 1 g by mouth at  bedtime.   04/23/2015 at Unknown time  . tiotropium (SPIRIVA) 18 MCG inhalation capsule Place 18 mcg into inhaler and inhale at bedtime.   04/23/2015 at Unknown time  . traMADol (ULTRAM) 50 MG tablet Take 50 mg by mouth 2 (two) times daily as needed (for pain).    over 30 days   Scheduled:  .  stroke: mapping our early stages of recovery book   Does not apply Once  . acetaminophen  650 mg Oral TID  . donepezil  5 mg Oral QHS  . enoxaparin (LOVENOX) injection  40 mg Subcutaneous Q24H  . escitalopram  20 mg Oral Q breakfast  . ferrous sulfate  325 mg Oral Q breakfast  . furosemide  40 mg Oral Q breakfast  . insulin aspart  0-5 Units Subcutaneous QHS  . insulin aspart  0-9 Units Subcutaneous TID WC  . insulin aspart  3 Units Subcutaneous TID WC  . insulin glargine  28 Units Subcutaneous BID  . levothyroxine  25 mcg Oral QPM  . lisinopril  5 mg Oral Q breakfast  . mometasone-formoterol  2 puff Inhalation BID  . pantoprazole  40 mg Oral Daily  . simvastatin  5 mg Oral QHS  . sucralfate  1 g Oral QHS  . tiotropium  18 mcg Inhalation QHS    Assessment: 81 yo female with CVA and has been on coumadin in the past for history of DVT and PE (s/p IVC filter) and this was discontinued due to GIB (anticoagulation stopped at last discharge on 04/07/15). With current embolic CVA phrmacy has been consulted to begin apixiban.  -SCr= 1.4 (down from 1.52 on 11/22; SCr was 1.0-1.22 earlier this month), wt= 118kg -Hg= 10.5  Goal of Therapy:  Monitor platelets by anticoagulation protocol: Yes   Plan:  -Apixiban  po bid -Watch SCr closely  Harland German, Pharm D 04/25/2015 2:59 PM

## 2015-04-25 NOTE — Progress Notes (Signed)
[facility] PROGRESS NOTE    Progress Note   Courtney Montgomery AOZ:[id number] DOB: 1928-04-07 DOA: 04/24/2015 PCP: Vista Mink, PA-C   Brief Narrative:   Courtney Montgomery is an 81 y.o. female with h/o DVT, PE s/p IVC filter, DM on insulin, CHF, dementia, CVA who is sent from the ALF for confusion, slurred speech and a left facial droop starting around 3:45 pm the day of admission. Started on CVA work up.  Assessment/Plan:  CVA: HgbA1c pending, fasting lipid panel HDL <40, LDL < 100, on statin. MRI, MRA of the brain without contrast showed a small CVA high posterior right frontal region PT,OT, and Speech pending. Echocardiogram and Carotid dopplers are pending. Prophylactic therapy-Antiplatelet med: to be determine No event on telemetry. Pt would concern about colonoscopy.  Dementia without behavioral disturbance: Cont aricept.  Controlled diabetes mellitus with diabetic neuropathy, with long-term current use of insulin (HCC) Cont lantus plus SSI.  Essential hypertension - Cont lisinopril, close to goal.  Stage 3 chronic kidney disease (HCC) - cont lisinopril.  Hypothyroidism - cont synthroid.  Anemia of chronic kidney failure - FOBT pending.    DVT Prophylaxis - Lovenox ordered.  Family Communication: none Disposition Plan: Home when stable. Code Status:     Code Status Orders        Start     Ordered   04/24/15 1812  Do not attempt resuscitation (DNR)   Continuous    Question Answer Comment  In the event of cardiac or respiratory ARREST Do not call a "code blue"   In the event of cardiac or respiratory ARREST Do not perform Intubation, CPR, defibrillation or ACLS   In the event of cardiac or respiratory ARREST Use medication by any route, position, wound care, and other measures to relive pain and suffering. May use oxygen, suction and manual treatment of airway obstruction as needed for comfort.      04/24/15 1812    Advance Directive  Documentation        Most Recent Value   Type of Advance Directive  Healthcare Power of Attorney, Living will   Pre-existing out of facility DNR order (yellow form or pink MOST form)     "MOST" Form in Place?          IV Access:    Peripheral IV   Procedures and diagnostic studies:   Ct Head Wo Contrast  04/24/2015  ADDENDUM REPORT: 04/24/2015 17:05 ADDENDUM: These results were called by telephone at the time of interpretation on 04/24/2015 at 4:57 Pm to Dr. Roseanne Reno , who verbally acknowledged these results. Electronically Signed   By: Signa Kell M.D.   On: 04/24/2015 17:05  04/24/2015  CLINICAL DATA:  Slurred speech.  Left side facial droop. EXAM: CT HEAD WITHOUT CONTRAST TECHNIQUE: Contiguous axial images were obtained from the base of the skull through the vertex without intravenous contrast. COMPARISON:  02/02/2013. FINDINGS: There is prominence of the sulci and ventricles consistent with brain atrophy. Diffuse low attenuation within the subcortical and periventricular white matter compatible with chronic small vessel ischemic disease. No evidence for acute cortical infarct, intracranial hemorrhage or mass. The paranasal sinuses are clear. The mastoid air cells are clear. IMPRESSION: 1. No acute intracranial abnormalities. 2. Chronic microvascular disease and brain atrophy. Electronically Signed: By: Signa Kell M.D. On: 04/24/2015 16:53   Mr Brain Wo Contrast  04/24/2015  CLINICAL DATA:  Initial evaluation for acute onset left facial weakness with slurred speech. EXAM: MRI HEAD WITHOUT CONTRAST TECHNIQUE: Multiplanar,  multiecho pulse sequences of the brain and surrounding structures were obtained without intravenous contrast. COMPARISON:  Prior CT from earlier the same day. FINDINGS: Study is markedly limited by motion artifact and patient's inability to tolerate the full length of the exam. Sagittal T1, axial DWI with ADC map, and axial T2 weighted sequences were performed.  Diffuse prominence of the CSF containing spaces is compatible with generalized age-related cerebral atrophy. Confluent T2 hyperintensity within the periventricular and deep white matter both cerebral hemispheres most consistent with chronic small vessel ischemic disease. Similar changes seen within the pons. There is a subtle curvilinear focus of high signal intensity seen within the cortical gray matter of the posterior right frontal region near the vertex on axial DWI sequence (series 4, image 42). There appears to be possible associated signal dropout on corresponding ADC map (series 400, image 42). Given history of left-sided facial weakness, finding suspicious for a small acute ischemic infarct. Alternatively, this may reflect a small focus susceptibility artifact from ideation prominent dural calcification. Prominent calcification is seen within this region extending from the calvarium on prior head CT. No other acute infarct identified. Intracranial vascular flow voids are preserved. No definite hemorrhage. No mass lesion, midline shift, or mass effect. Ventricular prominence related to global parenchymal volume loss present without hydrocephalus. No extra-axial fluid collection. Craniocervical junction within normal limits. Pituitary gland grossly normal. No acute abnormality about the orbits. Sequela prior bilateral lens extraction noted. Paranasal sinuses are grossly clear. No mastoid effusion. Inner ear structures within normal limits. Bone marrow signal intensity within normal limits. Scalp soft tissues unremarkable. IMPRESSION: 1. Limited study due to motion artifact and patient's inability to tolerate the full length of the exam. 2. 9 mm focus of high signal intensity on DWI sequence in the high posterior right frontal region, suspicious for possible small acute cortical ischemic infarct. Alternatively, this may reflect a small focus of susceptibility artifact related to prominent dural calcification  seen in this region on prior CT. Correlation with symptomatology recommended for acute ischemic infarct at this location recommended. Additionally, a follow-up study when the patient is able to tolerate the full length of the exam to confirm this finding would also likely be helpful. No other infarct identified. 3. Generalized age-related cerebral atrophy with moderate chronic small vessel ischemic disease. Electronically Signed   By: Rise MuBenjamin  McClintock M.D.   On: 04/24/2015 21:17     Medical Consultants:    None.  Anti-Infectives:   Anti-infectives    None      Subjective:    Lequita AsalJean Seyfried no complains.  Objective:    Filed Vitals:   04/25/15 0100 04/25/15 0300 04/25/15 0500 04/25/15 0700  BP: 124/54 123/49 133/52 138/49  Pulse: 78 79 80 82  Temp: 99.5 F (37.5 C) 98.1 F (36.7 C) 99 F (37.2 C) 98.1 F (36.7 C)  TempSrc: Oral Oral Oral Oral  Resp: 20 18 20 20   Height:      Weight:      SpO2: 93% 97% 96% 92%   No intake or output data in the 24 hours ending 04/25/15 0838 Filed Weights   04/24/15 2100  Weight: 118.1 kg (260 lb 5.8 oz)    Exam: Gen:  NAD, facial drop. Cardiovascular:  RRR, No M/R/G Chest and lungs:   CTAB Abdomen:  Abdomen soft, NT/ND, + BS Extremities:  No C/E/C   Data Reviewed:    Labs: Basic Metabolic Panel:  Recent Labs Lab 04/24/15 1655 04/24/15 1711  NA 136 137  K 4.2 4.2  CL 102 101  CO2 24  --   GLUCOSE 221* 224*  BUN 21* 24*  CREATININE 1.52* 1.40*  CALCIUM 9.4  --    GFR Estimated Creatinine Clearance: 38.7 mL/min (by C-G formula based on Cr of 1.4). Liver Function Tests:  Recent Labs Lab 04/24/15 1655  AST 14*  ALT 11*  ALKPHOS 56  BILITOT 0.4  PROT 7.5  ALBUMIN 3.4*   No results for input(s): LIPASE, AMYLASE in the last 168 hours. No results for input(s): AMMONIA in the last 168 hours. Coagulation profile  Recent Labs Lab 04/24/15 1655  INR 1.17    CBC:  Recent Labs Lab 04/24/15 1655  04/24/15 1711  WBC 15.8*  --   NEUTROABS 12.6*  --   HGB 8.9* 10.5*  HCT 30.3* 31.0*  MCV 81.9  --   PLT 200  --    Cardiac Enzymes: No results for input(s): CKTOTAL, CKMB, CKMBINDEX, TROPONINI in the last 168 hours. BNP (last 3 results) No results for input(s): PROBNP in the last 8760 hours. CBG:  Recent Labs Lab 04/24/15 2148 04/25/15 0627  GLUCAP 217* 192*   D-Dimer: No results for input(s): DDIMER in the last 72 hours. Hgb A1c: No results for input(s): HGBA1C in the last 72 hours. Lipid Profile:  Recent Labs  04/25/15 0501  CHOL 144  HDL 30*  LDLCALC 69  TRIG 161*  CHOLHDL 4.8   Thyroid function studies: No results for input(s): TSH, T4TOTAL, T3FREE, THYROIDAB in the last 72 hours.  Invalid input(s): FREET3 Anemia work up: No results for input(s): VITAMINB12, FOLATE, FERRITIN, TIBC, IRON, RETICCTPCT in the last 72 hours. Sepsis Labs:  Recent Labs Lab 04/24/15 1655  WBC 15.8*   Microbiology No results found for this or any previous visit (from the past 240 hour(s)).   Medications:   .  stroke: mapping our early stages of recovery book   Does not apply Once  . acetaminophen  650 mg Oral TID  . donepezil  5 mg Oral QHS  . enoxaparin (LOVENOX) injection  40 mg Subcutaneous Q24H  . escitalopram  20 mg Oral Q breakfast  . ferrous sulfate  325 mg Oral Q breakfast  . furosemide  40 mg Oral Q breakfast  . insulin glargine  28 Units Subcutaneous BID  . levothyroxine  25 mcg Oral QPM  . lisinopril  5 mg Oral Q breakfast  . mometasone-formoterol  2 puff Inhalation BID  . pantoprazole  40 mg Oral Daily  . simvastatin  5 mg Oral QHS  . sucralfate  1 g Oral QHS  . tiotropium  18 mcg Inhalation QHS   Continuous Infusions:   Time spent: 25 min     Marinda Elk  [facility] Pager [phone]  *Please refer to [email or web address], password [email or web address] to get updated schedule on who will round on this patient, as hospitalists switch teams weekly. If  7PM-7AM, please contact night-coverage at [email or web address], password [email or web address] for any overnight needs.  04/25/2015, 8:38 AM

## 2015-04-25 NOTE — Progress Notes (Signed)
VASCULAR LAB PRELIMINARY  PRELIMINARY  PRELIMINARY  PRELIMINARY  Carotid duplex completed.    Preliminary report:  Right - 40% to 59% ICA stenosis. Vertebral artery could not be evaluated due to thickness of the neck. Left - 1% to 59% ICA stenosis. Vertebral artery flow is antegrade.  Courtney Montgomery, RVS 04/25/2015, 1:30 PM

## 2015-04-25 NOTE — Progress Notes (Signed)
VASCULAR LAB PRELIMINARY  PRELIMINARY  PRELIMINARY  PRELIMINARY  Transcranial Doppler  completed.    Preliminary report:  Transcranial Doppler completed  Oluwademilade Mckiver, RVS 04/25/2015, 1:17 PM

## 2015-04-25 NOTE — Discharge Instructions (Addendum)
Information on my medicine - ELIQUIS (apixaban)  This medication education was reviewed with me or my healthcare representative as part of my discharge preparation.  The pharmacist that spoke with me during my hospital stay was:  Benny LennertMeyer, Andrew David, [facility]  Why was Eliquis prescribed for you? Eliquis was prescribed for you to reduce the risk of a blood clot forming that can cause a stroke if you have a medical condition called atrial fibrillation (a type of irregular heartbeat).  What do You need to know about Eliquis ? Take your Eliquis TWICE DAILY - one tablet in the morning and one tablet in the evening with or without food. If you have difficulty swallowing the tablet whole please discuss with your pharmacist how to take the medication safely.  Take Eliquis exactly as prescribed by your doctor and DO NOT stop taking Eliquis without talking to the doctor who prescribed the medication.  Stopping may increase your risk of developing a stroke.  Refill your prescription before you run out.  After discharge, you should have regular check-up appointments with your healthcare provider that is prescribing your Eliquis.  In the future your dose may need to be changed if your kidney function or weight changes by a significant amount or as you get older.  What do you do if you miss a dose? If you miss a dose, take it as soon as you remember on the same day and resume taking twice daily.  Do not take more than one dose of ELIQUIS at the same time to make up a missed dose.  Important Safety Information A possible side effect of Eliquis is bleeding. You should call your healthcare provider right away if you experience any of the following: ? Bleeding from an injury or your nose that does not stop. ? Unusual colored urine (red or dark brown) or unusual colored stools (red or black). ? Unusual bruising for unknown reasons. ? A serious fall or if you hit your head (even if there is no  bleeding).  Some medicines may interact with Eliquis and might increase your risk of bleeding or clotting while on Eliquis. To help avoid this, consult your healthcare provider or pharmacist prior to using any new prescription or non-prescription medications, including herbals, vitamins, non-steroidal anti-inflammatory drugs (NSAIDs) and supplements.  This website has more information on Eliquis (apixaban): http://www.eliquis.com/eliquis/home  STROKE/TIA DISCHARGE INSTRUCTIONS SMOKING Cigarette smoking nearly doubles your risk of having a stroke & is the single most alterable risk factor  If you smoke or have smoked in the last 12 months, you are advised to quit smoking for your health.  Most of the excess cardiovascular risk related to smoking disappears within a year of stopping.  Ask you doctor about anti-smoking medications  [place] Quit Line: 1-800-QUIT NOW  Free Smoking Cessation Classes [phone]  CHOLESTEROL Know your levels; limit fat & cholesterol in your diet  Lipid Panel     Component Value Date/Time   CHOL 144 04/25/2015 0501   TRIG 227* 04/25/2015 0501   HDL 30* 04/25/2015 0501   CHOLHDL 4.8 04/25/2015 0501   VLDL 45* 04/25/2015 0501   LDLCALC 69 04/25/2015 0501      Many patients benefit from treatment even if their cholesterol is at goal.  Goal: Total Cholesterol (CHOL) less than 160  Goal:  Triglycerides (TRIG) less than 150  Goal:  HDL greater than 40  Goal:  LDL (LDLCALC) less than 100   BLOOD PRESSURE American Stroke Association blood pressure target  is less that 120/80 mm/Hg  Your discharge blood pressure is:  BP: (!) 133/38 mmHg  Monitor your blood pressure  Limit your salt and alcohol intake  Many individuals will require more than one medication for high blood pressure  DIABETES (A1c is a blood sugar average for last 3 months) Goal HGBA1c is under 7% (HBGA1c is blood sugar average for last 3 months)  Diabetes:   Lab Results  Component Value  Date   HGBA1C 7.9* 04/25/2015     Your HGBA1c can be lowered with medications, healthy diet, and exercise.  Check your blood sugar as directed by your physician  Call your physician if you experience unexplained or low blood sugars.  PHYSICAL ACTIVITY/REHABILITATION Goal is 30 minutes at least 4 days per week  Activity: Increase activity slowly, Therapies:  Activity decreases your risk of heart attack and stroke and makes your heart stronger.  It helps control your weight and blood pressure; helps you relax and can improve your mood.  Participate in a regular exercise program.  Talk with your doctor about the best form of exercise for you (dancing, walking, swimming, cycling).  DIET/WEIGHT Goal is to maintain a healthy weight  Your discharge diet is: Diet heart healthy/carb modified Room service appropriate?: Yes; Fluid consistency:: Thin Diet - low sodium heart healthy THIN liquids Your height is:  Height: 5' 6.5" (168.9 cm) Your current weight is: Weight: 118.1 kg (260 lb 5.8 oz) Your Body Mass Index (BMI) is:  BMI (Calculated): 41.5  Following the type of diet specifically designed for you will help prevent another stroke.  Your goal Body Mass Index (BMI) is 19-24.  Healthy food habits can help reduce 3 risk factors for stroke:  High cholesterol, hypertension, and excess weight.  RESOURCES Stroke/Support Group:  Call [phone]   STROKE EDUCATION PROVIDED/REVIEWED AND GIVEN TO PATIENT Stroke warning signs and symptoms How to activate emergency medical system (call 911). Medications prescribed at discharge. Need for follow-up after discharge. Personal risk factors for stroke. Pneumonia vaccine given: No Flu vaccine given: No My questions have been answered, the writing is legible, and I understand these instructions.  I will adhere to these goals & educational materials that have been provided to me after my discharge from the hospital.

## 2015-04-25 NOTE — Progress Notes (Signed)
Inpatient Diabetes Program Recommendations  AACE/ADA: New Consensus Statement on Inpatient Glycemic Control (2015)  Target Ranges:  Prepandial:   less than 140 mg/dL      Peak postprandial:   less than 180 mg/dL (1-2 hours)      Critically ill patients:  140 - 180 mg/dL  Results for Courtney Montgomery, [name] (MRN [id number]) as of 04/25/2015 09:25  Ref. Range 04/24/2015 21:48 04/25/2015 06:27  Glucose-Capillary Latest Ref Range: 65-99 mg/dL 621217 (H) 308192 (H)   Review of Glycemic Control  Diabetes history: DM2 Outpatient Diabetes medications: Lantus 30 units QAM, Lantus 28 units QHS, Humalog 20 units TID with meals Current orders for Inpatient glycemic control: Lantus 28 units BID  Inpatient Diabetes Program Recommendations: Correction (SSI): While inpatient, please consider ordering CBGs with Novolog correction scale ACHS.  Thanks, Orlando PennerMarie Fremont Skalicky, RN, MSN, CDE Diabetes Coordinator Inpatient Diabetes Program [phone] (Team Pager from 8am to 5pm) [phone] ([facility] office) [phone] [facility] office) [phone] [facility] office)

## 2015-04-25 NOTE — Progress Notes (Signed)
STROKE TEAM PROGRESS NOTE   SUBJECTIVE (INTERVAL HISTORY) Her RN is at the bedside.  Overall she feels her condition is stable. She still has mild left facial droop and slurry speech, but stable and no arm or leg weakness. She just had multiple tests done this am.    OBJECTIVE Temp:  [98.1 F (36.7 C)-100.8 F (38.2 C)] 98.1 F (36.7 C) (11/23 0700) Pulse Rate:  [78-92] 82 (11/23 0700) Cardiac Rhythm:  [-] Normal sinus rhythm (11/23 0701) Resp:  [18-24] 20 (11/23 0700) BP: (118-148)/(36-94) 138/49 mmHg (11/23 0700) SpO2:  [92 %-100 %] 92 % (11/23 0700) Weight:  [118.1 kg (260 lb 5.8 oz)] 118.1 kg (260 lb 5.8 oz) (11/22 2100)  CBC:  Recent Labs Lab 04/24/15 1655 04/24/15 1711  WBC 15.8*  --   NEUTROABS 12.6*  --   HGB 8.9* 10.5*  HCT 30.3* 31.0*  MCV 81.9  --   PLT 200  --     Basic Metabolic Panel:  Recent Labs Lab 04/24/15 1655 04/24/15 1711  NA 136 137  K 4.2 4.2  CL 102 101  CO2 24  --   GLUCOSE 221* 224*  BUN 21* 24*  CREATININE 1.52* 1.40*  CALCIUM 9.4  --     Lipid Panel:    Component Value Date/Time   CHOL 144 04/25/2015 0501   TRIG 227* 04/25/2015 0501   HDL 30* 04/25/2015 0501   CHOLHDL 4.8 04/25/2015 0501   VLDL 45* 04/25/2015 0501   LDLCALC 69 04/25/2015 0501   HgbA1c:  Lab Results  Component Value Date   HGBA1C 8.0* 04/04/2015   Urine Drug Screen: No results found for: LABOPIA, COCAINSCRNUR, LABBENZ, AMPHETMU, THCU, LABBARB    IMAGING I have personally reviewed the radiological images below and agree with the radiology interpretations.  Ct Head Wo Contrast  04/24/2015  IMPRESSION: 1. No acute intracranial abnormalities. 2. Chronic microvascular disease and brain atrophy.   Mr Brain Wo Contrast  04/24/2015  IMPRESSION: 1. Limited study due to motion artifact and patient's inability to tolerate the full length of the exam. 2. 9 mm focus of high signal intensity on DWI sequence in the high posterior right frontal region, suspicious for  possible small acute cortical ischemic infarct. Alternatively, this may reflect a small focus of susceptibility artifact related to prominent dural calcification seen in this region on prior CT. Correlation with symptomatology recommended for acute ischemic infarct at this location recommended. Additionally, a follow-up study when the patient is able to tolerate the full length of the exam to confirm this finding would also likely be helpful. No other infarct identified. 3. Generalized age-related cerebral atrophy with moderate chronic small vessel ischemic disease.  CUS - Right - 40% to 59% ICA stenosis. Vertebral artery could not be evaluated due to thickness of the neck. Left - 1% to 59% ICA stenosis. Vertebral artery flow is antegrade.  2D echo - - Left ventricle: The cavity size was normal. Systolic function was normal. The estimated ejection fraction was in the range of 60% to 65%. Wall motion was normal; there were no regional wall motion abnormalities. - Atrial septum: No defect or patent foramen ovale was identified.  TCD - pending  CTA neck - pending  PHYSICAL EXAM  Temp:  [98 F (36.7 C)-100.8 F (38.2 C)] 98 F (36.7 C) (11/23 1011) Pulse Rate:  [73-92] 73 (11/23 1011) Resp:  [18-24] 20 (11/23 1011) BP: (118-148)/(36-94) 131/46 mmHg (11/23 1011) SpO2:  [92 %-100 %] 95 % (11/23 1011)  Weight:  [260 lb 5.8 oz (118.1 kg)] 260 lb 5.8 oz (118.1 kg) (11/22 2100)  General - obesity, well developed, in no apparent distress.  Ophthalmologic - Fundi not visualized due to eye movement.  Cardiovascular - Regular rate and rhythm.  Mental Status -  Level of arousal and orientation to time, place, and person were intact. Language including expression, naming, repetition, comprehension was assessed and found intact. Fund of Knowledge was assessed and was impaired.  Cranial Nerves II - XII - II - Visual field intact OU. III, IV, VI - Extraocular movements intact V - Facial  sensation intact bilaterally. VII - left nasolabial fold flattening. VIII - Hearing & vestibular intact bilaterally. X - Palate elevates symmetrically, mild dysarthria. XI - Chin turning & shoulder shrug intact bilaterally. XII - Tongue protrusion intact.  Motor Strength - The patient's strength was 4/5 in all extremities and pronator drift was absent.  Bulk was normal and fasciculations were absent.   Motor Tone - Muscle tone was assessed at the neck and appendages and was normal.  Reflexes - The patient's reflexes were 1+ in all extremities and she had no pathological reflexes.  Sensory - Light touch, temperature/pinprick were assessed and were symmetrical.    Coordination - The patient had normal movements in the hands and feet with no ataxia or dysmetria.  Tremor was absent.  Gait and Station - deferred due to safety concerns.   ASSESSMENT/PLAN [name] is a 81 y.o. female with history of HTN, DM, HLD, CHF, CKD, DVT/PE off Coumadin due to a recent GI bleeding presenting with left facial droop and slurry speech.    Stroke:  right MCA/ACA punctate infarct, embolic pattern secondary to off Coumadin due to recent GIB  Resultant  mild left facial droop and mild dysarthria  MRI  right MCA/ACA punctate infarct  TCD  pending  Carotid Doppler  right 40-59% stenosis  CTA neck pending  2D Echo  unremarkable, EF 60-65%  LDL 69  HgbA1c 8.0  Lovenox for VTE prophylaxis  Diet heart healthy/carb modified Room service appropriate?: Yes; Fluid consistency:: Thin  No antithrombotic prior to admission, now on No antithrombotic. Recommend to resume anticoagulation for stroke prevention, patient no sign of recurrent GIB with improved hb and previously GIB workup negative. Eliquis pharmacy consult ordered.  Patient counseled to be compliant with her antithrombotic medications  Ongoing aggressive stroke risk factor management  Therapy recommendations:  HH PT  Disposition:   Pending  Recent anemia and GIB  Patient was admitted November 2016 for anemia and stool occult blood positive  Received a blood transfusion  EGD negative  Colonoscopy canceled due to poor preparation  Hemoglobin improved to 10.5 this admission  No active GI bleeding this admission  Recommend to resume anticoagulation with Eliquis which has the lowers to GIB complications in all NOACs  Recommend outpatient colonoscopy  Bleeding precautions  Hypertension  Stable Permissive hypertension (OK if < 220/120) but gradually normalize in 5-7 days  Hyperlipidemia  Home meds:  Zocor 5 mg resumed in hospital  LDL 69, goal < 70  Continue statin at discharge  Diabetes  HgbA1c 8.0, goal < 7.0  Uncontrolled  On Lantus  SSI  Weight loss  Outpatient PCP close follow-up  Other Stroke Risk Factors  Advanced age  Obesity, Body mass index is 41.4 kg/(m^2).   Hx stroke/TIA  Recurrent PE/DVT on Coumadin  Other Active Problems    Hospital day # 1  Marvel Plan, MD PhD Stroke Neurology  04/25/2015 2:25 PM    To contact Stroke Continuity provider, please refer to [email or web address]. After hours, contact General Neurology

## 2015-04-25 NOTE — Progress Notes (Signed)
Pt is stable at this time, in and out of sleeping. No complications noted.

## 2015-04-25 NOTE — Progress Notes (Signed)
OT Cancellation Note  Patient Details Name: Courtney Montgomery MRN: [id number] DOB: May 29, 1928   Cancelled Treatment:    Reason Eval/Treat Not Completed: Other (comment) (SLP working with patient). Will check back as schedule allows for OT eval/treat. Thank you for the order.   Radie Berges , MS, OTR/L, CLT Pager: [phone]  04/25/2015, 2:23 PM

## 2015-04-25 NOTE — Evaluation (Addendum)
Physical Therapy Evaluation Patient Details Name: Courtney Montgomery MRN: [id number] DOB: 11-29-1927 Today's Date: 04/25/2015   History of Present Illness  Courtney Montgomery is a [age] y.o. female with h/o DVT, PE s/p IVC filter, DM on insulin, CHF, dementia, CVA who is sent from the ALF for confusion, slurred speech and a left facial droop starting around 3:45 this afternoon  Clinical Impression  Patient presents with decreased mobility due to deficits listed in PT problem list.  She will benefit from skilled PT in the acute setting to decrease burden of care in next venue.  Feel she may also benefit from continued skilled PT via HHPT services in the ALF setting.      Follow Up Recommendations Home health PT (at ALF)    Equipment Recommendations  None recommended by PT    Recommendations for Other Services       Precautions / Restrictions Precautions Precautions: Fall Restrictions Weight Bearing Restrictions: No      Mobility  Bed Mobility Overal bed mobility: Needs Assistance Bed Mobility: Rolling;Sidelying to Sit;Sit to Supine Rolling: Mod assist Sidelying to sit: Mod assist Supine to sit: Max assist Sit to supine: Max assist   General bed mobility comments: Patient with difficulty reaching to roll R more than L due to L LE weakness, side to sit from L sidelying difficult due to bed rail and raised foot of bed and with L lateral lean  Transfers Overall transfer level: Needs assistance Equipment used: None             General transfer comment: attempted to pivot to L and to R switching chair, but pt quickly fatigued and needed more assist even to lift hips from bed and unable to transfer with +1 assist so returned to supine  Ambulation/Gait             General Gait Details: non ambulatory at basline  Stairs            Wheelchair Mobility    Modified Rankin (Stroke Patients Only)       Balance Overall balance assessment: Needs assistance   Sitting  balance-Leahy Scale: Poor Sitting balance - Comments: can sit briefly with supervision, but L lateral lean mostly needs min to mod A for sitting balance       Standing balance comment: unable to achieve standing today                             Pertinent Vitals/Pain Pain Assessment: No/denies pain    Home Living Family/patient expects to be discharged to:: Assisted living               Home Equipment:  (unkown, pt with dementia) Additional Comments: [facility]    Prior Function Level of Independence: Needs assistance   Gait / Transfers Assistance Needed: Pt states that she does not ambulate except to transfer from bed to chair "For a long while now" given assistance  ADL's / Homemaking Assistance Needed: Pt requires assistance for all ADL's -does some upper body dressing on her own        Hand Dominance   Dominant Hand: Right    Extremity/Trunk Assessment     RUE Deficits / Details: Pt with limited shoulder flexion bilateral UE's; decreased AROM overall     LUE Deficits / Details: Pt with limited shoulder flexion bilateral UE's; decreased AROM overall     RLE Deficits / Details: 4-/5 quads LLE Deficits /  Details: 3-/5 quads  Cervical / Trunk Assessment: Kyphotic;Other exceptions  Communication   Communication: No difficulties  Cognition Arousal/Alertness: Awake/alert Behavior During Therapy: WFL for tasks assessed/performed Overall Cognitive Status: No family/caregiver present to determine baseline cognitive functioning                      General Comments General comments (skin integrity, edema, etc.): pt incontinent of urine in brief so assisted with rolling for hygiene and donning clean brief, then in process of returning to supine pt incontinent of bowel and Nurse tech in to assist and cleanse pt and change brief    Exercises General Exercises - Lower Extremity Heel Slides: AAROM;Both;5 reps;Supine      Assessment/Plan     PT Assessment Patient needs continued PT services  PT Diagnosis Generalized weakness   PT Problem List Decreased strength;Decreased activity tolerance;Decreased balance;Decreased mobility;Decreased knowledge of use of DME;Obesity  PT Treatment Interventions DME instruction;Balance training;Functional mobility training;Patient/family education;Wheelchair mobility training;Therapeutic exercise;Therapeutic activities   PT Goals (Current goals can be found in the Care Plan section) Acute Rehab PT Goals Patient Stated Goal: To return to ALF PT Goal Formulation: With patient Time For Goal Achievement: 05/09/15 Potential to Achieve Goals: Fair Additional Goals Additional Goal #1: patient will propel w/c x 50' with supervision.    Frequency Min 2X/week   Barriers to discharge        Co-evaluation               End of Session Equipment Utilized During Treatment: Gait belt Activity Tolerance: Patient limited by fatigue Patient left: in bed;with call bell/phone within reach;with nursing/sitter in room      Functional Assessment Tool Used: Clinical Judgement Functional Limitation: Mobility: Walking and moving around Mobility: Walking and Moving Around Current Status (Z6109): At least 60 percent but less than 80 percent impaired, limited or restricted Mobility: Walking and Moving Around Goal Status [phone]): At least 40 percent but less than 60 percent impaired, limited or restricted    Time: 1040-1115 PT Time Calculation (min) (ACUTE ONLY): 35 min   Charges:   PT Evaluation $Initial PT Evaluation Tier I: 1 Procedure PT Treatments $Therapeutic Activity: 8-22 mins   PT G Codes:   PT G-Codes **NOT FOR INPATIENT CLASS** Functional Assessment Tool Used: Clinical Judgement Functional Limitation: Mobility: Walking and moving around Mobility: Walking and Moving Around Current Status (U9811): At least 60 percent but less than 80 percent impaired, limited or restricted Mobility:  Walking and Moving Around Goal Status [phone]): At least 40 percent but less than 60 percent impaired, limited or restricted    [facility] 04/25/2015, 11:27 AM  Sheran Lawless, PT [phone] 04/25/2015

## 2015-04-25 NOTE — Evaluation (Signed)
Speech Language Pathology Evaluation Patient Details Name: Courtney Montgomery MRN: [id number] DOB: Oct 20, 1927 Today's Date: 04/25/2015 Time: [id number] SLP Time Calculation (min) (ACUTE ONLY): 14 min  Problem List:  Patient Active Problem List   Diagnosis Date Noted  . AKI (acute kidney injury) (HCC) 04/25/2015  . Chronic renal disease, stage 3, moderately decreased glomerular filtration rate (GFR) between 30-59 mL/min/1.73 square meter 04/25/2015  . Carotid stenosis   . Type 2 diabetes mellitus with circulatory disorder (HCC)   . Ischemic cerebrovascular accident (CVA) of frontal lobe (HCC) 04/24/2015  . UTI (urinary tract infection) 04/03/2015  . Dementia without behavioral disturbance 04/03/2015  . Depression 04/03/2015  . Dyslipidemia associated with type 2 diabetes mellitus (HCC) 04/03/2015  . Controlled diabetes mellitus with diabetic neuropathy, with long-term current use of insulin (HCC) 04/03/2015  . Essential hypertension 04/03/2015  . Symptomatic anemia 04/03/2015  . Hypothyroidism 04/03/2015  . Acute renal failure superimposed on stage 3 chronic kidney disease (HCC) 04/03/2015  . Anemia of chronic kidney failure 04/03/2015  . PE (pulmonary embolism)   . Deep vein thrombosis (DVT) of lower extremity [facility](HCC)    Past Medical History:  Past Medical History  Diagnosis Date  . Stroke (HCC)   . Neuropathy (HCC)   . Swelling   . General weakness   . CHF (congestive heart failure) (HCC)   . CPAP (continuous positive airway pressure) dependence   . Diabetes mellitus without complication (HCC)   . Allergic rhinitis   . Hyperlipidemia   . Venous insufficiency   . Urinary incontinence   . Anxiety   . Depression   . Osteoarthritis   . COPD (chronic obstructive pulmonary disease) (HCC)   . DVT (deep venous thrombosis) (HCC)   . PE (pulmonary embolism)    Past Surgical History:  Past Surgical History  Procedure Laterality Date  . Cholecystectomy    . Appendectomy    .  Cataract extraction, bilateral    . Esophagogastroduodenoscopy (egd) with propofol N/A 04/05/2015    Procedure: ESOPHAGOGASTRODUODENOSCOPY (EGD) WITH PROPOFOL;  Surgeon: Graylin ShiverSalem F Ganem, MD;  Location: [facility] ENDOSCOPY;  Service: Endoscopy;  Laterality: N/A;   HPI:  Courtney Montgomery is a 81 y.o. female with h/o DVT, PE s/p IVC filter, DM on insulin, CHF, dementia, CVA who is sent from the ALF for confusion, slurred speech and a left facial droop starting around 3:45 day of admission.   MRI, MRA of the brain without contrast showed a small CVA high posterior right frontal region.   Assessment / Plan / Recommendation Clinical Impression  Pt presents with baseline dementia, extent of cognitive deficits unknown, though she asserts her memory has declined.  She presents with disorientation to elements of time, difficulty attending to and retrieving new, verbal information, fluctuating insight into deficits.  Pt will benefit from acute SLP services to addres the above pending D/C.      SLP Assessment  Patient needs continued Speech Lanaguage Pathology Services    Follow Up Recommendations   (AL)    Frequency and Duration min 2x/week  1 week      SLP Evaluation Prior Functioning  Cognitive/Linguistic Baseline: Baseline deficits Baseline deficit details: baseline dementia; details not available Type of Home:  (assisted living)   Cognition  Overall Cognitive Status: No family/caregiver present to determine baseline cognitive functioning Arousal/Alertness: Awake/alert Orientation Level: Oriented to person;Oriented to place;Disoriented to time;Disoriented to situation Attention: Sustained Sustained Attention: Impaired Sustained Attention Impairment: Verbal basic Memory: Impaired Memory Impairment: Storage deficit;Retrieval deficit Awareness: Appears intact  Problem Solving: Impaired Problem Solving Impairment: Verbal basic Safety/Judgment: Impaired    Comprehension  Auditory  Comprehension Overall Auditory Comprehension: Appears within functional limits for tasks assessed Visual Recognition/Discrimination Discrimination: Within Function Limits Reading Comprehension Reading Status: Not tested    Expression Expression Primary Mode of Expression: Verbal Verbal Expression Overall Verbal Expression: Appears within functional limits for tasks assessed Written Expression Dominant Hand: Right Written Expression: Not tested   Oral / Motor Oral Motor/Sensory Function Overall Oral Motor/Sensory Function: Mild impairment Facial ROM: Reduced left;Suspected CN VII (facial) dysfunction Motor Speech Overall Motor Speech: Appears within functional limits for tasks assessed    Blenda Mounts Laurice 04/25/2015, 2:29 PM  Heather Streeper L. Samson Frederic, [place] CCC/SLP Pager [phone]

## 2015-04-25 NOTE — Progress Notes (Signed)
  Echocardiogram 2D Echocardiogram has been performed.  Tye SavoyCasey N Keeshia Sanderlin 04/25/2015, 9:01 AM

## 2015-04-26 LAB — GLUCOSE, CAPILLARY
GLUCOSE-CAPILLARY: 273 mg/dL — AB (ref 65–99)
Glucose-Capillary: 137 mg/dL — ABNORMAL HIGH (ref 65–99)
Glucose-Capillary: 171 mg/dL — ABNORMAL HIGH (ref 65–99)

## 2015-04-26 LAB — BASIC METABOLIC PANEL
Anion gap: 9 (ref 5–15)
BUN: 22 mg/dL — AB (ref 6–20)
CALCIUM: 9.2 mg/dL (ref 8.9–10.3)
CO2: 25 mmol/L (ref 22–32)
CREATININE: 1.26 mg/dL — AB (ref 0.44–1.00)
Chloride: 103 mmol/L (ref 101–111)
GFR, EST AFRICAN AMERICAN: 44 mL/min — AB (ref >60)
GFR, EST NON AFRICAN AMERICAN: 38 mL/min — AB (ref >60)
Glucose, Bld: 138 mg/dL — ABNORMAL HIGH (ref 65–99)
Potassium: 3.6 mmol/L (ref 3.5–5.1)
SODIUM: 137 mmol/L (ref 135–145)

## 2015-04-26 LAB — HEMOGLOBIN A1C
HEMOGLOBIN A1C: 7.9 % — AB (ref 4.8–5.6)
MEAN PLASMA GLUCOSE: 180 mg/dL

## 2015-04-26 MED ORDER — INSULIN GLARGINE 100 UNIT/ML ~~LOC~~ SOLN: 35.0000 [IU] | Freq: Two times a day (BID) | SUBCUTANEOUS | Status: AC

## 2015-04-26 MED ORDER — APIXABAN 5 MG PO TABS: 5.0000 mg | ORAL_TABLET | Freq: Two times a day (BID) | ORAL | Status: AC

## 2015-04-26 NOTE — Progress Notes (Signed)
Holiday coverage CSW received call from unit RN indicating that [facility].  Fl2 completed and [facility] with [facility] gave [facility] for [facility]'s return to facility. CSW spoke with patient's son Loraine LericheMark who is in agreement with d/c and was updated on d/c plan. Will arrange EMS transport for patient.  Will update patient's nurse to finalize d/c plans for patient.Patient will benefit from physical therapy that will be provided by the ALF. Per son- she was already "signed up" for PT.   No further CSW needs identified and will sign off.  Lorri Frederickonna T. Jaci LazierCrowder, [place] [phone]

## 2015-04-26 NOTE — [place] FL2 (Signed)
[place] MEDICAID FL2 LEVEL OF CARE SCREENING TOOL     IDENTIFICATION  Patient Name: Courtney Montgomery Birthdate: [birth year] Sex: female Admission Date (Current Location): 04/24/2015  [facility] [place]   Facility and Address:  [facility], [address], [place], [place] [address]      Provider Number: [id number]  Attending Physician Name and Address:  Marinda ElkAbraham Feliz Ortiz, MD  Relative Name and Phone Number:       Current Level of Care: Hospital Recommended Level of Care: Assisted Living Facility Prior Approval Number:    Date Approved/Denied:   PASRR Number:    Discharge Plan: Domiciliary (Rest home)    Current Diagnoses: Patient Active Problem List   Diagnosis Date Noted  . AKI (acute kidney injury) (HCC) 04/25/2015  . Chronic renal disease, stage 3, moderately decreased glomerular filtration rate (GFR) between 30-59 mL/min/1.73 square meter 04/25/2015  . Acute CVA (cerebrovascular accident) (HCC) 04/25/2015  . Carotid stenosis   . Type 2 diabetes mellitus with circulatory disorder (HCC)   . Ischemic cerebrovascular accident (CVA) of frontal lobe (HCC) 04/24/2015  . UTI (urinary tract infection) 04/03/2015  . Dementia without behavioral disturbance 04/03/2015  . Depression 04/03/2015  . Dyslipidemia associated with type 2 diabetes mellitus (HCC) 04/03/2015  . Controlled diabetes mellitus with diabetic neuropathy, with long-term current use of insulin (HCC) 04/03/2015  . Essential hypertension 04/03/2015  . Symptomatic anemia 04/03/2015  . Hypothyroidism 04/03/2015  . Acute renal failure superimposed on stage 3 chronic kidney disease (HCC) 04/03/2015  . Anemia of chronic kidney failure 04/03/2015  . PE (pulmonary embolism)   . Deep vein thrombosis (DVT) of lower extremity (HCC)     Orientation ACTIVITIES/SOCIAL BLADDER RESPIRATION    Self, Time, Situation, Place  Active Incontinent Normal  BEHAVIORAL SYMPTOMS/MOOD  NEUROLOGICAL BOWEL NUTRITION STATUS      Incontinent  (carb modified)  PHYSICIAN VISITS COMMUNICATION OF NEEDS Height & Weight Skin  Over 180 days Verbally 5\' 6"  (167.6 cm) 260 lbs. Other (Comment) (Moisture damange bilateral buttocks, rash to inner thight, pubic and perianal areas)          AMBULATORY STATUS RESPIRATION    Supervision limited Normal      Personal Care Assistance Level of Assistance  Bathing, Dressing Bathing Assistance: Limited assistance   Dressing Assistance: Limited assistance      Functional Limitations Info        Speech Info: Impaired (Slurred)       SPECIAL CARE FACTORS FREQUENCY  PT (By licensed PT)     PT Frequency: Eval and treat             Additional Factors Info  Code Status Code Status Info: DNR Allergies Info: Pollen Extract Psychotropic Info: Lexapro 20 mg daily, Aricept 5 mg daily Insulin Sliding Scale Info: See DC summary       Current Medications (04/26/2015): Current Facility-Administered Medications  Medication Dose Route Frequency Provider Last Rate Last Dose  .  stroke: mapping our early stages of recovery book   Does not apply Once Calvert CantorSaima Rizwan, MD      . acetaminophen (TYLENOL) tablet 650 mg  650 mg Oral TID Calvert CantorSaima Rizwan, MD   650 mg at 04/26/15 0913  . albuterol (PROVENTIL) (2.5 MG/3ML) 0.083% nebulizer solution 2.5 mg  2.5 mg Nebulization Q6H PRN Almon HerculesHaley P Baird, RPH      . apixaban (ELIQUIS) tablet 5 mg  5 mg Oral BID Silvana NewnessAndrew D Meyer, RPH   5  mg at 04/26/15 0913  . donepezil (ARICEPT) tablet 5 mg  5 mg Oral QHS Calvert Cantor, MD   5 mg at 04/25/15 2250  . escitalopram (LEXAPRO) tablet 20 mg  20 mg Oral Q breakfast Calvert Cantor, MD   20 mg at 04/26/15 0853  . ferrous sulfate tablet 325 mg  325 mg Oral Q breakfast Calvert Cantor, MD   325 mg at 04/26/15 0853  . furosemide (LASIX) tablet 40 mg  40 mg Oral Q breakfast Calvert Cantor, MD   40 mg at 04/26/15 0853  . insulin aspart (novoLOG) injection 0-5 Units  0-5 Units  Subcutaneous QHS Marinda Elk, MD   0 Units at 04/25/15 2251  . insulin aspart (novoLOG) injection 0-9 Units  0-9 Units Subcutaneous TID WC Marinda Elk, MD   2 Units at 04/26/15 1157  . insulin aspart (novoLOG) injection 3 Units  3 Units Subcutaneous TID WC Marinda Elk, MD   2 Units at 04/26/15 1156  . insulin glargine (LANTUS) injection 28 Units  28 Units Subcutaneous BID Calvert Cantor, MD   28 Units at 04/26/15 0914  . levothyroxine (SYNTHROID, LEVOTHROID) tablet 25 mcg  25 mcg Oral QPM Calvert Cantor, MD   25 mcg at 04/25/15 1848  . lisinopril (PRINIVIL,ZESTRIL) tablet 5 mg  5 mg Oral Q breakfast Calvert Cantor, MD   5 mg at 04/25/15 0759  . mometasone-formoterol (DULERA) 100-5 MCG/ACT inhaler 2 puff  2 puff Inhalation BID Calvert Cantor, MD   2 puff at 04/26/15 0855  . pantoprazole (PROTONIX) EC tablet 40 mg  40 mg Oral Daily Almon Hercules, RPH   40 mg at 04/26/15 0912  . senna-docusate (Senokot-S) tablet 1 tablet  1 tablet Oral QHS PRN Calvert Cantor, MD      . simvastatin (ZOCOR) tablet 5 mg  5 mg Oral QHS Saima Rizwan, MD   5 mg at 04/25/15 2250  . sucralfate (CARAFATE) tablet 1 g  1 g Oral QHS Calvert Cantor, MD   1 g at 04/25/15 2250  . tiotropium (SPIRIVA) inhalation capsule 18 mcg  18 mcg Inhalation QHS Calvert Cantor, MD   18 mcg at 04/25/15 1933  . traMADol (ULTRAM) tablet 50 mg  50 mg Oral BID PRN Calvert Cantor, MD       Do not use this list as official medication orders. Please verify with discharge summary.  Discharge Medications:   Medication List    STOP taking these medications        nystatin 100000 UNIT/GM Powd      TAKE these medications        acetaminophen 325 MG tablet  Commonly known as:  TYLENOL  Take 650 mg by mouth 3 (three) times daily.     albuterol 108 (90 BASE) MCG/ACT inhaler  Commonly known as:  PROVENTIL HFA;VENTOLIN HFA  Inhale 2 puffs into the lungs every 6 (six) hours as needed for wheezing or shortness of breath.     apixaban 5 MG  Tabs tablet  Commonly known as:  ELIQUIS  Take 1 tablet (5 mg total) by mouth 2 (two) times daily.     cholecalciferol 1000 UNITS tablet  Commonly known as:  VITAMIN D  Take 1,000 Units by mouth every morning.     Cranberry 450 MG Tabs  Take 1 tablet by mouth 2 (two) times daily.     diclofenac sodium 1 % Gel  Commonly known as:  VOLTAREN  Apply 2 g topically 2 (two) times  daily.     donepezil 5 MG tablet  Commonly known as:  ARICEPT  Take 1 tablet (5 mg total) by mouth at bedtime.     escitalopram 20 MG tablet  Commonly known as:  LEXAPRO  Take 20 mg by mouth daily with breakfast.     ferrous sulfate 325 (65 FE) MG tablet  Take 325 mg by mouth daily with breakfast.     fexofenadine 60 MG tablet  Commonly known as:  ALLEGRA  Take 60 mg by mouth daily with breakfast.     fluticasone 50 MCG/ACT nasal spray  Commonly known as:  FLONASE  Place 1 spray into both nostrils at bedtime.     Fluticasone-Salmeterol 250-50 MCG/DOSE Aepb  Commonly known as:  ADVAIR  Inhale 1 puff into the lungs 2 (two) times daily. Rinse and spit after each use.     furosemide 40 MG tablet  Commonly known as:  LASIX  Take 1 tablet (40 mg total) by mouth daily with breakfast.     HUMALOG KWIKPEN 100 UNIT/ML KiwkPen  Generic drug:  insulin lispro  Inject 20 Units into the skin 3 (three) times daily.     insulin glargine 100 UNIT/ML injection  Commonly known as:  LANTUS  Inject 0.35 mLs (35 Units total) into the skin 2 (two) times daily. Takes 30 units in the morning and 28 units at bedtime     levothyroxine 25 MCG tablet  Commonly known as:  SYNTHROID, LEVOTHROID  Take 25 mcg by mouth every evening.     lisinopril 5 MG tablet  Commonly known as:  PRINIVIL,ZESTRIL  Take 1 tablet (5 mg total) by mouth daily with breakfast. Resume on 04/12/2015     omeprazole 40 MG capsule  Commonly known as:  PRILOSEC  Take 40 mg by mouth 2 (two) times daily.     simvastatin 5 MG tablet  Commonly known  as:  ZOCOR  Take 5 mg by mouth at bedtime.     sucralfate 1 G tablet  Commonly known as:  CARAFATE  Take 1 g by mouth at bedtime.     tiotropium 18 MCG inhalation capsule  Commonly known as:  SPIRIVA  Place 18 mcg into inhaler and inhale at bedtime.     traMADol 50 MG tablet  Commonly known as:  ULTRAM  Take 50 mg by mouth 2 (two) times daily as needed (for pain).        Relevant Imaging Results:  Relevant Lab Results:  Recent Labs    Additional Information Current resident of [address]  [place], [place]  [phone]

## 2015-04-26 NOTE — Progress Notes (Signed)
DC arrangements finalized. EMS to pick up patient around 4:30 as she needs to receive a dose of Eliquist prior to d/c as this is a new prescription and cannot be filled by ALF pharmacy today.  Discussed with patient's nurse Tresa EndoKelly.  No further CSW needs identified. CSW signing off.  Lorri Frederickonna T. Jaci LazierCrowder, [place] [phone]

## 2015-04-26 NOTE — Progress Notes (Signed)
STROKE TEAM PROGRESS NOTE   SUBJECTIVE (INTERVAL HISTORY) No complaints. Pt states she is feeling better. Hopes to go home today. CTA neck showed right ICA stenosis of 50%. On eliquis now.    OBJECTIVE Temp:  [98 F (36.7 C)-99.1 F (37.3 C)] 98.3 F (36.8 C) (11/24 0510) Pulse Rate:  [73-84] 84 (11/24 0853) Cardiac Rhythm:  [-] Normal sinus rhythm (11/24 0700) Resp:  [18-20] 18 (11/24 0853) BP: (102-141)/(36-46) 140/36 mmHg (11/24 0853) SpO2:  [91 %-95 %] 91 % (11/24 0853)  CBC:   Recent Labs Lab 04/24/15 1655 04/24/15 1711  WBC 15.8*  --   NEUTROABS 12.6*  --   HGB 8.9* 10.5*  HCT 30.3* 31.0*  MCV 81.9  --   PLT 200  --     Basic Metabolic Panel:   Recent Labs Lab 04/24/15 1655 04/24/15 1711 04/26/15 0418  NA 136 137 137  K 4.2 4.2 3.6  CL 102 101 103  CO2 24  --  25  GLUCOSE 221* 224* 138*  BUN 21* 24* 22*  CREATININE 1.52* 1.40* 1.26*  CALCIUM 9.4  --  9.2    Lipid Panel:     Component Value Date/Time   CHOL 144 04/25/2015 0501   TRIG 227* 04/25/2015 0501   HDL 30* 04/25/2015 0501   CHOLHDL 4.8 04/25/2015 0501   VLDL 45* 04/25/2015 0501   LDLCALC 69 04/25/2015 0501   HgbA1c:  Lab Results  Component Value Date   HGBA1C 7.9* 04/25/2015    IMAGING I have personally reviewed the radiological images below and agree with the radiology interpretations.  Ct Head Wo Contrast 04/24/2015  IMPRESSION: 1. No acute intracranial abnormalities. 2. Chronic microvascular disease and brain atrophy.   Mr Brain Wo Contrast 04/24/2015  IMPRESSION: 1. Limited study due to motion artifact and patient's inability to tolerate the full length of the exam. 2. 9 mm focus of high signal intensity on DWI sequence in the high posterior right frontal region, suspicious for possible small acute cortical ischemic infarct. Alternatively, this may reflect a small focus of susceptibility artifact related to prominent dural calcification seen in this region on prior CT.  Correlation with symptomatology recommended for acute ischemic infarct at this location recommended. Additionally, a follow-up study when the patient is able to tolerate the full length of the exam to confirm this finding would also likely be helpful. No other infarct identified. 3. Generalized age-related cerebral atrophy with moderate chronic small vessel ischemic disease.  CUS - Right - 40% to 59% ICA stenosis. Vertebral artery could not be evaluated due to thickness of the neck. Left - 1% to 59% ICA stenosis. Vertebral artery flow is antegrade.  2D echo - Left ventricle: The cavity size was normal. Systolic function wasnormal. The estimated ejection fraction was in the range of 60%to 65%. Wall motion was normal; there were no regional wallmotion abnormalities. - Atrial septum: No defect or patent foramen ovale was identified.  TCD - Technically limited and difficult due to absent and poor windows, respiratory interference due to snoring, and thickness of the neck.Normal direction of blood flow in both opthalmics and vertebrals. Low basilar artery velocities likely due to suboptimal waveform from poor occipital window.  CTA neck  1. Moderate carotid atherosclerosis with 8 mm flap in the proximal left ICA which may reflect a short segment dissection or plaque ulceration. No significant left ICA stenosis. 2. Tiny web or flap in the proximal right ICA with 50% stenosis. 3. Mild-to-moderate bilateral vertebral artery origins  stenoses.   PHYSICAL EXAM  Temp:  [98 F (36.7 C)-99.1 F (37.3 C)] 98.3 F (36.8 C) (11/24 0510) Pulse Rate:  [73-84] 84 (11/24 0853) Resp:  [18-20] 18 (11/24 0853) BP: (102-141)/(36-46) 140/36 mmHg (11/24 0853) SpO2:  [91 %-95 %] 91 % (11/24 0853)  General - obesity, well developed, in no apparent distress.  Ophthalmologic - Fundi not visualized due to eye movement.  Cardiovascular - Regular rate and rhythm.  Mental Status -  Level of arousal and orientation  to time, place, and person were intact. Language including expression, naming, repetition, comprehension was assessed and found intact. Fund of Knowledge was assessed and was impaired.  Cranial Nerves II - XII - II - Visual field intact OU. III, IV, VI - Extraocular movements intact V - Facial sensation intact bilaterally. VII - left nasolabial fold flattening. VIII - Hearing & vestibular intact bilaterally. X - Palate elevates symmetrically, mild dysarthria. XI - Chin turning & shoulder shrug intact bilaterally. XII - Tongue protrusion intact.  Motor Strength - The patient's strength was 4/5 in all extremities and pronator drift was absent.  Bulk was normal and fasciculations were absent.   Motor Tone - Muscle tone was assessed at the neck and appendages and was normal.  Reflexes - The patient's reflexes were 1+ in all extremities and she had no pathological reflexes.  Sensory - Light touch, temperature/pinprick were assessed and were symmetrical.    Coordination - The patient had normal movements in the hands and feet with no ataxia or dysmetria.  Tremor was absent.  Gait and Station - deferred due to safety concerns.   ASSESSMENT/PLAN [name] is a 81 y.o. female with history of HTN, DM, HLD, CHF, CKD, DVT/PE off Coumadin due to a recent GI bleeding presenting with left facial droop and slurry speech.    Stroke:  right MCA/ACA punctate infarct, embolic pattern secondary to off Coumadin due to recent GIB  Resultant  mild left facial droop and mild dysarthria  MRI  right MCA/ACA punctate infarct  TCD  No significant findings, technically limited  Carotid Doppler  right 40-59% stenosis  CTA neck moderate carotid atherosclerosis with 8mm flap, no significant stenosis. Tiny web/flap R ICA w/ 50% stenosis  2D Echo  unremarkable, EF 60-65%  LDL 69  HgbA1c 8.0  Lovenox for VTE prophylaxis Diet heart healthy/carb modified Room service appropriate?: Yes; Fluid  consistency:: Thin  No antithrombotic prior to admission, now on Eliquis (apixaban) daily. Continue eliquis on discharge for stroke prevention.  Patient counseled to be compliant with her antithrombotic medications  Ongoing aggressive stroke risk factor management  Therapy recommendations:  HH PT  Disposition:  Home with HH  followup Dr. Roda Shutters in 2 months. Order written  Stroke Service will sign off. Please call should any needs arise.  Recent anemia and GIB  Patient was admitted November 2016 for anemia and stool occult blood positive  Received a blood transfusion  EGD negative  Colonoscopy canceled due to poor preparation  Hemoglobin improved to 10.5 this admission  No active GI bleeding this admission  Resumed anticoagulation with Eliquis which has the lowest GIB complications in all NOACs  Recommend outpatient colonoscopy  Monitor GIB both inpt and outpt  Hypertension  Stable  Hyperlipidemia  Home meds:  Zocor 5 mg resumed in hospital  LDL 69, at goal < 70  Continue statin at discharge  Diabetes  HgbA1c 8.0, goal < 7.0  Uncontrolled  On Lantus  SSI  Weight loss  Outpatient PCP close follow-up  Other Stroke Risk Factors  Advanced age  Morbid Obesity, Body mass index is 41.4 kg/(m^2).   Hx stroke/TIA  Recurrent PE/DVT on Coumadin  Hospital day # 11  Neurology will sign off. Please call with questions. [facility]. Thanks for the consult.  Marvel PlanJindong Broady Lafoy, MD PhD Stroke Neurology 04/26/2015 12:05 PM    To contact Stroke Continuity provider, please refer to [email or web address]. After hours, contact General Neurology

## 2015-04-26 NOTE — [place] FL2 (Addendum)
[place] MEDICAID FL2 LEVEL OF CARE SCREENING TOOL     IDENTIFICATION  Patient Name: Courtney Montgomery Birthdate: [birth year] Sex: female Admission Date (Current Location): 04/24/2015  [facility] and [place] Number: Producer, television/film/video and Address:  [facility] [place], [address] [place], [place] [address]      Provider Number: [id number]  Attending Physician Name and Address:  Marinda Elk, MD  Relative Name and Phone Number:       Current Level of Care: Hospital Recommended Level of Care: Assisted Living Facility Prior Approval Number:    Date Approved/Denied:   PASRR Number:    Discharge Plan: Assisted Living   Current Diagnoses: Patient Active Problem List   Diagnosis Date Noted  . AKI (acute kidney injury) (HCC) 04/25/2015  . Chronic renal disease, stage 3, moderately decreased glomerular filtration rate (GFR) between 30-59 mL/min/1.73 square meter 04/25/2015  . Acute CVA (cerebrovascular accident) (HCC) 04/25/2015  . Carotid stenosis   . Type 2 diabetes mellitus with circulatory disorder (HCC)   . Ischemic cerebrovascular accident (CVA) of frontal lobe (HCC) 04/24/2015  . UTI (urinary tract infection) 04/03/2015  . Dementia without behavioral disturbance 04/03/2015  . Depression 04/03/2015  . Dyslipidemia associated with type 2 diabetes mellitus (HCC) 04/03/2015  . Controlled diabetes mellitus with diabetic neuropathy, with long-term current use of insulin (HCC) 04/03/2015  . Essential hypertension 04/03/2015  . Symptomatic anemia 04/03/2015  . Hypothyroidism 04/03/2015  . Acute renal failure superimposed on stage 3 chronic kidney disease (HCC) 04/03/2015  . Anemia of chronic kidney failure 04/03/2015  . PE (pulmonary embolism)   . Deep vein thrombosis (DVT) of lower extremity (HCC)     Orientation ACTIVITIES/SOCIAL BLADDER RESPIRATION    Self, Time, Situation, Place  Active Incontinent Normal  BEHAVIORAL SYMPTOMS/MOOD NEUROLOGICAL  BOWEL NUTRITION STATUS      Incontinent CCHO  PHYSICIAN VISITS COMMUNICATION OF NEEDS Height & Weight Skin  Over 180 days Verbally  (167.6 cm) 260 lbs. Other (Comment) (Moisture damange bilateral buttocks, rash to inner thight, pubic and perianal areas)          AMBULATORY STATUS RESPIRATION    Supervision limited Normal      Personal Care Assistance Level of Assistance  Bathing, Dressing Bathing Assistance: Limited assistance   Dressing Assistance: Limited assistance      Functional Limitations Info        Speech Info: Impaired (Slurred)       SPECIAL CARE FACTORS FREQUENCY  PT (By licensed PT)     PT Frequency: Eval and treat             Additional Factors Info  Code Status Code Status Info: DNR Allergies Info: Pollen Extract Psychotropic Info: Lexapro 20 mg daily, Aricept 5 mg daily Insulin Sliding Scale Info: See DC summary       Current Medications (04/26/2015): Current Facility-Administered Medications  Medication Dose Route Frequency Provider Last Rate Last Dose  .  stroke: mapping our early stages of recovery book   Does not apply Once Calvert Cantor, MD      . acetaminophen (TYLENOL) tablet 650 mg  650 mg Oral TID Calvert Cantor, MD   650 mg at 04/26/15 0913  . albuterol (PROVENTIL) (2.5 MG/3ML) 0.083% nebulizer solution 2.5 mg  2.5 mg Nebulization Q6H PRN Almon Hercules, RPH      . apixaban (ELIQUIS) tablet 5 mg  5 mg Oral BID Silvana Newness, RPH   5 mg at 04/26/15 1308  .  donepezil (ARICEPT) tablet 5 mg  5 mg Oral QHS Calvert CantorSaima Rizwan, MD   5 mg at 04/25/15 2250  . escitalopram (LEXAPRO) tablet 20 mg  20 mg Oral Q breakfast Calvert CantorSaima Rizwan, MD   20 mg at 04/26/15 0853  . ferrous sulfate tablet 325 mg  325 mg Oral Q breakfast Calvert CantorSaima Rizwan, MD   325 mg at 04/26/15 0853  . furosemide (LASIX) tablet 40 mg  40 mg Oral Q breakfast Calvert CantorSaima Rizwan, MD   40 mg at 04/26/15 0853  . insulin aspart (novoLOG) injection 0-5 Units  0-5 Units Subcutaneous QHS Marinda ElkAbraham Feliz  Ortiz, MD   0 Units at 04/25/15 2251  . insulin aspart (novoLOG) injection 0-9 Units  0-9 Units Subcutaneous TID WC Marinda ElkAbraham Feliz Ortiz, MD   2 Units at 04/26/15 1239  . insulin aspart (novoLOG) injection 3 Units  3 Units Subcutaneous TID WC Marinda ElkAbraham Feliz Ortiz, MD   2 Units at 04/26/15 1156  . insulin glargine (LANTUS) injection 28 Units  28 Units Subcutaneous BID Calvert CantorSaima Rizwan, MD   28 Units at 04/26/15 0914  . levothyroxine (SYNTHROID, LEVOTHROID) tablet 25 mcg  25 mcg Oral QPM Calvert CantorSaima Rizwan, MD   25 mcg at 04/25/15 1848  . lisinopril (PRINIVIL,ZESTRIL) tablet 5 mg  5 mg Oral Q breakfast Calvert CantorSaima Rizwan, MD   5 mg at 04/25/15 0759  . mometasone-formoterol (DULERA) 100-5 MCG/ACT inhaler 2 puff  2 puff Inhalation BID Calvert CantorSaima Rizwan, MD   2 puff at 04/26/15 0855  . pantoprazole (PROTONIX) EC tablet 40 mg  40 mg Oral Daily Almon HerculesHaley P Baird, RPH   40 mg at 04/26/15 0912  . senna-docusate (Senokot-S) tablet 1 tablet  1 tablet Oral QHS PRN Calvert CantorSaima Rizwan, MD      . simvastatin (ZOCOR) tablet 5 mg  5 mg Oral QHS Saima Rizwan, MD   5 mg at 04/25/15 2250  . sucralfate (CARAFATE) tablet 1 g  1 g Oral QHS Calvert CantorSaima Rizwan, MD   1 g at 04/25/15 2250  . tiotropium (SPIRIVA) inhalation capsule 18 mcg  18 mcg Inhalation QHS Calvert CantorSaima Rizwan, MD   18 mcg at 04/25/15 1933  . traMADol (ULTRAM) tablet 50 mg  50 mg Oral BID PRN Calvert CantorSaima Rizwan, MD       Do not use this list as official medication orders. Please verify with discharge summary.  Discharge Medications:   Medication List    STOP taking these medications        nystatin 100000 UNIT/GM Powd      TAKE these medications        acetaminophen 325 MG tablet  Commonly known as:  TYLENOL  Take 650 mg by mouth 3 (three) times daily.     albuterol 108 (90 BASE) MCG/ACT inhaler  Commonly known as:  PROVENTIL HFA;VENTOLIN HFA  Inhale 2 puffs into the lungs every 6 (six) hours as needed for wheezing or shortness of breath.     apixaban 5 MG Tabs tablet  Commonly known as:   ELIQUIS  Take 1 tablet (5 mg total) by mouth 2 (two) times daily.     cholecalciferol 1000 UNITS tablet  Commonly known as:  VITAMIN D  Take 1,000 Units by mouth every morning.     Cranberry 450 MG Tabs  Take 1 tablet by mouth 2 (two) times daily.     diclofenac sodium 1 % Gel  Commonly known as:  VOLTAREN  Apply 2 g topically 2 (two) times daily.     donepezil  5 MG tablet  Commonly known as:  ARICEPT  Take 1 tablet (5 mg total) by mouth at bedtime.     escitalopram 20 MG tablet  Commonly known as:  LEXAPRO  Take 20 mg by mouth daily with breakfast.     ferrous sulfate 325 (65 FE) MG tablet  Take 325 mg by mouth daily with breakfast.     fexofenadine 60 MG tablet  Commonly known as:  ALLEGRA  Take 60 mg by mouth daily with breakfast.     fluticasone 50 MCG/ACT nasal spray  Commonly known as:  FLONASE  Place 1 spray into both nostrils at bedtime.     Fluticasone-Salmeterol 250-50 MCG/DOSE Aepb  Commonly known as:  ADVAIR  Inhale 1 puff into the lungs 2 (two) times daily. Rinse and spit after each use.     furosemide 40 MG tablet  Commonly known as:  LASIX  Take 1 tablet (40 mg total) by mouth daily with breakfast.     HUMALOG KWIKPEN 100 UNIT/ML KiwkPen  Generic drug:  insulin lispro  Inject 20 Units into the skin 3 (three) times daily.     insulin glargine 100 UNIT/ML injection  Commonly known as:  LANTUS  Inject 0.35 mLs (35 Units total) into the skin 2 (two) times daily. Takes 30 units in the morning and 28 units at bedtime     levothyroxine 25 MCG tablet  Commonly known as:  SYNTHROID, LEVOTHROID  Take 25 mcg by mouth every evening.     lisinopril 5 MG tablet  Commonly known as:  PRINIVIL,ZESTRIL  Take 1 tablet (5 mg total) by mouth daily with breakfast. Resume on 04/12/2015     omeprazole 40 MG capsule  Commonly known as:  PRILOSEC  Take 40 mg by mouth 2 (two) times daily.     simvastatin 5 MG tablet  Commonly known as:  ZOCOR  Take 5 mg by mouth at  bedtime.     sucralfate 1 G tablet  Commonly known as:  CARAFATE  Take 1 g by mouth at bedtime.     tiotropium 18 MCG inhalation capsule  Commonly known as:  SPIRIVA  Place 18 mcg into inhaler and inhale at bedtime.     traMADol 50 MG tablet  Commonly known as:  ULTRAM  Take 50 mg by mouth 2 (two) times daily as needed (for pain).        Relevant Imaging Results:  Relevant Lab Results:  Recent Labs    Additional Information Current resident of [address] [place], [place]

## 2015-04-26 NOTE — Progress Notes (Signed)
RN discussed discharge instructions with patient, patient states she understands new medication instruction. AVS and prescriptions given to [facility] transportation

## 2015-04-26 NOTE — Discharge Summary (Signed)
Physician Discharge Summary  Courtney Montgomery ZOX:[id number] DOB: [birth year] DOA: 04/24/2015  PCP: Vista Mink, PA-C  Admit date: 04/24/2015 Discharge date: 04/26/2015  Time spent: 35 minutes  Recommendations for Outpatient Follow-up:  1. Follow up with gastroenterology in 2-4 weeks later outpatient colonoscopy. 2. Follow-up with neurology in 4 weeks.   Discharge Diagnoses:  Principal Problem:   Ischemic cerebrovascular accident (CVA) of frontal lobe (HCC) Active Problems:   Dementia without behavioral disturbance   Dyslipidemia associated with type 2 diabetes mellitus (HCC)   Controlled diabetes mellitus with diabetic neuropathy, with long-term current use of insulin (HCC)   Essential hypertension   Hypothyroidism   Anemia of chronic kidney failure   Chronic renal disease, stage 3, moderately decreased glomerular filtration rate (GFR) between 30-59 mL/min/1.73 square meter   Carotid stenosis   Type 2 diabetes mellitus with circulatory disorder (HCC)   Acute CVA (cerebrovascular accident) [facility])   Discharge Condition: stable  Diet recommendation: heart healthy  Filed Weights   04/24/15 2100  Weight: 118.1 kg (260 lb 5.8 oz)    History of present illness:  81 year old with past medical history of DVT PE status post IVC filter dementia was sent from her assisted living facility due to confusion/speech and facial droop the started on the day of admission.  Hospital Course:  Acute CVA: HgbA1c 7.9, fasting lipid panel HDL less than 30 and LDL less than 100 continue statins MRI, MRA of the brain without contrast positive for small CVA in the frontal region No PT recommendations. Echocardiogram no thrombus with an EF of 45% Carotid dopplers less than 60% stenosis bilaterally. Neurology was consulted and recommended Eluquis. Cardiac Monitoring with no events  Dementia without behavioral disturbances: Continue Aricept.  Controlled diabetes mellitus with diabetic  neuropathy with long-term use of insulin: Her Lantus was increased, we'll continue take CBCs and before meals and at bedtime Recheck an A1c in 3 months.  Essential hypertension: Posterior: Changes rheumatoid medication.  Chronic kidney disease stage III: Continue lisinopril.  Hypothyroidism: Continue Synthroid.  Anemia of chronic renal failure: She will need colonoscopy as an outpatient.   Procedures:  MRI  Carotid  ECHO  CT head  Consultations:  neurology  Discharge Exam: Filed Vitals:   04/26/15 0510 04/26/15 0853  BP: 102/38 140/36  Pulse: 78 84  Temp: 98.3 F (36.8 C)   Resp: 20 18    General: A&O x3 Cardiovascular: RRR Respiratory: good air movement CAT B.L  Discharge Instructions   Discharge Instructions    Diet - low sodium heart healthy    Complete by:  As directed      Increase activity slowly    Complete by:  As directed           Current Discharge Medication List    START taking these medications   Details  apixaban (ELIQUIS) 5 MG TABS tablet Take 1 tablet (5 mg total) by mouth 2 (two) times daily. Qty: 60 tablet, Refills: 0      CONTINUE these medications which have CHANGED   Details  insulin glargine (LANTUS) 100 UNIT/ML injection Inject 0.35 mLs (35 Units total) into the skin 2 (two) times daily. Takes 30 units in the morning and 28 units at bedtime Qty: 10 mL, Refills: 11      CONTINUE these medications which have NOT CHANGED   Details  acetaminophen (TYLENOL) 325 MG tablet Take 650 mg by mouth 3 (three) times daily.    albuterol (PROVENTIL HFA;VENTOLIN HFA) 108 (90 BASE) MCG/ACT inhaler  Inhale 2 puffs into the lungs every 6 (six) hours as needed for wheezing or shortness of breath.    cholecalciferol (VITAMIN D) 1000 UNITS tablet Take 1,000 Units by mouth every morning.     Cranberry 450 MG TABS Take 1 tablet by mouth 2 (two) times daily.    diclofenac sodium (VOLTAREN) 1 % GEL Apply 2 g topically 2 (two) times daily.     donepezil (ARICEPT) 5 MG tablet Take 1 tablet (5 mg total) by mouth at bedtime. Qty: 30 tablet, Refills: 0    escitalopram (LEXAPRO) 20 MG tablet Take 20 mg by mouth daily with breakfast.    ferrous sulfate 325 (65 FE) MG tablet Take 325 mg by mouth daily with breakfast.    fexofenadine (ALLEGRA) 60 MG tablet Take 60 mg by mouth daily with breakfast.    fluticasone (FLONASE) 50 MCG/ACT nasal spray Place 1 spray into both nostrils at bedtime.    Fluticasone-Salmeterol (ADVAIR) 250-50 MCG/DOSE AEPB Inhale 1 puff into the lungs 2 (two) times daily. Rinse and spit after each use.    furosemide (LASIX) 40 MG tablet Take 1 tablet (40 mg total) by mouth daily with breakfast. Qty: 30 tablet    insulin lispro (HUMALOG KWIKPEN) 100 UNIT/ML KiwkPen Inject 20 Units into the skin 3 (three) times daily.    levothyroxine (SYNTHROID, LEVOTHROID) 25 MCG tablet Take 25 mcg by mouth every evening.     lisinopril (PRINIVIL,ZESTRIL) 5 MG tablet Take 1 tablet (5 mg total) by mouth daily with breakfast. Resume on 04/12/2015    omeprazole (PRILOSEC) 40 MG capsule Take 40 mg by mouth 2 (two) times daily.    simvastatin (ZOCOR) 5 MG tablet Take 5 mg by mouth at bedtime.    sucralfate (CARAFATE) 1 G tablet Take 1 g by mouth at bedtime.    tiotropium (SPIRIVA) 18 MCG inhalation capsule Place 18 mcg into inhaler and inhale at bedtime.    traMADol (ULTRAM) 50 MG tablet Take 50 mg by mouth 2 (two) times daily as needed (for pain).       STOP taking these medications     nystatin (MYCOSTATIN/NYSTOP) 100000 UNIT/GM POWD        Allergies  Allergen Reactions  . Pollen Extract Other (See Comments)    unknown      The results of significant diagnostics from this hospitalization (including imaging, microbiology, ancillary and laboratory) are listed below for reference.    Significant Diagnostic Studies: Ct Head Wo Contrast  04/24/2015  ADDENDUM REPORT: 04/24/2015 17:05 ADDENDUM: These results were  called by telephone at the time of interpretation on 04/24/2015 at 4:57 Pm to Dr. Roseanne Reno , who verbally acknowledged these results. Electronically Signed   By: Signa Kell M.D.   On: 04/24/2015 17:05  04/24/2015  CLINICAL DATA:  Slurred speech.  Left side facial droop. EXAM: CT HEAD WITHOUT CONTRAST TECHNIQUE: Contiguous axial images were obtained from the base of the skull through the vertex without intravenous contrast. COMPARISON:  02/02/2013. FINDINGS: There is prominence of the sulci and ventricles consistent with brain atrophy. Diffuse low attenuation within the subcortical and periventricular white matter compatible with chronic small vessel ischemic disease. No evidence for acute cortical infarct, intracranial hemorrhage or mass. The paranasal sinuses are clear. The mastoid air cells are clear. IMPRESSION: 1. No acute intracranial abnormalities. 2. Chronic microvascular disease and brain atrophy. Electronically Signed: By: Signa Kell M.D. On: 04/24/2015 16:53   Ct Angio Neck W/cm &/or Wo/cm  04/25/2015  CLINICAL DATA:  Possible stroke.  Carotid stenosis. EXAM: CT ANGIOGRAPHY NECK TECHNIQUE: Multidetector CT imaging of the neck was performed using the standard protocol during bolus administration of intravenous contrast. Multiplanar CT image reconstructions and MIPs were obtained to evaluate the vascular anatomy. Carotid stenosis measurements (when applicable) are obtained utilizing NASCET criteria, using the distal internal carotid diameter as the denominator. CONTRAST:  50mL OMNIPAQUE IOHEXOL 350 MG/ML SOLN COMPARISON:  None. FINDINGS: Aortic arch: Normal variant aortic arch branching pattern with common origin of the brachiocephalic and left common carotid arteries. Moderate arch atherosclerosis. Mild narrowing of the right cleaning artery origin. Right carotid system: Medial course of the proximal common carotid artery. There is moderate calcified plaque about the carotid bifurcation with a  tiny web or flap in the proximal ICA resulting in 50% ICA stenosis. There is moderate ECA origin stenosis. Left carotid system: Moderate calcified plaque at the carotid bifurcation results in less than 50% narrowing of the ICA origin. There is a small flap in the proximal ICA measuring approximately 8 mm in length which may reflect a short segment dissection. Vertebral arteries: Vertebral arteries are patent and codominant. Calcified plaque at the right vertebral artery origin results in mild-to-moderate stenosis. Predominantly noncalcified plaque at the left vertebral artery origin also results in mild-to-moderate stenosis. Skeleton: Moderate multilevel disc degeneration and severe facet arthrosis in the cervical spine. Other neck: No neck mass. IMPRESSION: 1. Moderate carotid atherosclerosis with 8 mm flap in the proximal left ICA which may reflect a short segment dissection or plaque ulceration. No significant left ICA stenosis. 2. Tiny web or flap in the proximal right ICA with 50% stenosis. 3. 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 Electronically Signed   By: Sebastian Ache M.D.   On: 04/25/2015 18:33   Mr Brain Wo Contrast  04/24/2015  CLINICAL DATA:  Initial evaluation for acute onset left facial weakness with slurred speech. EXAM: MRI HEAD WITHOUT CONTRAST TECHNIQUE: Multiplanar, multiecho pulse sequences of the brain and surrounding structures were obtained without intravenous contrast. COMPARISON:  Prior CT from earlier the same day. FINDINGS: Study is markedly limited by motion artifact and patient's inability to tolerate the full length of the exam. Sagittal T1, axial DWI with ADC map, and axial T2 weighted sequences were performed. Diffuse prominence of the CSF containing spaces is compatible with generalized age-related cerebral atrophy. Confluent T2 hyperintensity within the periventricular and deep white matter both cerebral hemispheres most consistent with chronic small  vessel ischemic disease. Similar changes seen within the pons. There is a subtle curvilinear focus of high signal intensity seen within the cortical gray matter of the posterior right frontal region near the vertex on axial DWI sequence (series 4, image 42). There appears to be possible associated signal dropout on corresponding ADC map (series 400, image 42). Given history of left-sided facial weakness, finding suspicious for a small acute ischemic infarct. Alternatively, this may reflect a small focus susceptibility artifact from ideation prominent dural calcification. Prominent calcification is seen within this region extending from the calvarium on prior head CT. No other acute infarct identified. Intracranial vascular flow voids are preserved. No definite hemorrhage. No mass lesion, midline shift, or mass effect. Ventricular prominence related to global parenchymal volume loss present without hydrocephalus. No extra-axial fluid collection. Craniocervical junction within normal limits. Pituitary gland grossly normal. No acute abnormality about the orbits. Sequela prior bilateral lens extraction noted. Paranasal sinuses are grossly clear. No mastoid effusion. Inner ear structures within normal limits. Bone marrow signal intensity within normal limits.  Scalp soft tissues unremarkable. IMPRESSION: 1. Limited study due to motion artifact and patient's inability to tolerate the full length of the exam. 2. 9 mm focus of high signal intensity on DWI sequence in the high posterior right frontal region, suspicious for possible small acute cortical ischemic infarct. Alternatively, this may reflect a small focus of susceptibility artifact related to prominent dural calcification seen in this region on prior CT. Correlation with symptomatology recommended for acute ischemic infarct at this location recommended. Additionally, a follow-up study when the patient is able to tolerate the full length of the exam to confirm this  finding would also likely be helpful. No other infarct identified. 3. Generalized age-related cerebral atrophy with moderate chronic small vessel ischemic disease. Electronically Signed   By: Rise MuBenjamin  McClintock M.D.   On: 04/24/2015 21:17    Microbiology: No results found for this or any previous visit (from the past 240 hour(s)).   Labs: Basic Metabolic Panel:  Recent Labs Lab 04/24/15 1655 04/24/15 1711 04/26/15 0418  NA 136 137 137  K 4.2 4.2 3.6  CL 102 101 103  CO2 24  --  25  GLUCOSE 221* 224* 138*  BUN 21* 24* 22*  CREATININE 1.52* 1.40* 1.26*  CALCIUM 9.4  --  9.2   Liver Function Tests:  Recent Labs Lab 04/24/15 1655  AST 14*  ALT 11*  ALKPHOS 56  BILITOT 0.4  PROT 7.5  ALBUMIN 3.4*   No results for input(s): LIPASE, AMYLASE in the last 168 hours. No results for input(s): AMMONIA in the last 168 hours. CBC:  Recent Labs Lab 04/24/15 1655 04/24/15 1711  WBC 15.8*  --   NEUTROABS 12.6*  --   HGB 8.9* 10.5*  HCT 30.3* 31.0*  MCV 81.9  --   PLT 200  --    Cardiac Enzymes: No results for input(s): CKTOTAL, CKMB, CKMBINDEX, TROPONINI in the last 168 hours. BNP: BNP (last 3 results) No results for input(s): BNP in the last 8760 hours.  ProBNP (last 3 results) No results for input(s): PROBNP in the last 8760 hours.  CBG:  Recent Labs Lab 04/25/15 0627 04/25/15 1138 04/25/15 1621 04/25/15 2140 04/26/15 0629  GLUCAP 192* 227* 190* 174* 137*       Signed:  Marinda ElkFELIZ ORTIZ, ABRAHAM  [facility] 04/26/2015, 10:50 AM

## 2015-04-26 NOTE — Progress Notes (Signed)
Patient to be discharged to ALF. Patient is oriented, denies pain. RN coordinated with social work for discharge to ALF. Neuro assessment unchanged.

## 2015-04-26 NOTE — Progress Notes (Signed)
CM spoke to RN, Tresa EndoKelly, who said that the [facility] was [facility] and needs PT as well. CM called and spoke with Lupita LeashDonna with SW to advise and she said that she is working on it. [facility] in-house PT/OT. Sw working on discharge plan back to ALF. CM remains available for additional CM needs.

## 2015-07-17 ENCOUNTER — Encounter: Payer: Self-pay | Admitting: Neurology

## 2015-07-17 ENCOUNTER — Telehealth: Payer: Self-pay | Admitting: Neurology

## 2015-07-17 ENCOUNTER — Ambulatory Visit (INDEPENDENT_AMBULATORY_CARE_PROVIDER_SITE_OTHER): Payer: Medicare Other | Admitting: Neurology

## 2015-07-17 VITALS — BP 119/65 | HR 65 | Ht 66.5 in

## 2015-07-17 DIAGNOSIS — I63131 Cerebral infarction due to embolism of right carotid artery: Secondary | ICD-10-CM

## 2015-07-17 MED ORDER — DONEPEZIL HCL 5 MG PO TABS: 10.0000 mg | ORAL_TABLET | Freq: Every day | ORAL | Status: DC

## 2015-07-17 NOTE — Telephone Encounter (Signed)
Drue Dun. Shanon Payor., MD is the patients PCP and he is with [employer].  I could not get this provider in.

## 2015-07-17 NOTE — Patient Instructions (Signed)
I had a long discussion with the patient and husband regarding her recent small embolic stroke related to atrial fibrillation. I discussed risk for recurrent strokes, stroke prevention strategies and answered questions. Continue eliquis for secondary stroke prevention with strict control of diabetes with hemoglobin A1c goal below 6.5, hypertension with blood pressure goal below 130/90 and lipids with LDL cholesterol goal below 70 mg percent. Patient also has baseline dementia and is on a suboptimal dose of Aricept hence I recommend increasing it to 10 mg daily if tolerated. I have discussed possible side effects with the patient and husband and advised him to call me if necessary. Continue ongoing therapies. Return for follow-up in the future in 3 months with nurse practitioner or call earlier if necessary.

## 2015-07-17 NOTE — Progress Notes (Signed)
[facility] [address] [place]. [place] [address] [phone]       OFFICE FOLLOW-UP NOTE  Courtney. Courtney Montgomery Date of Birth:  20-Apr-1928 Medical Record Number:  [id number]   HPI: Courtney Montgomery is seen today for follow up after recent hospital  admission for stroke in November 2016 Courtney Montgomery is an 82 y.o. female with a history of hypertension, previous stroke, diabetes mellitus and hyperlipidemia as well as dementia, to the emergency room and code stroke status after developing left facial weakness and speech. She was last known well at 1545 today. She was also noted to be somewhat more confused than usual. CT scan of the head showed no acute intracranial abnormality. Patient was hospitalized on 04/03/2015 4 acute GI bleed. She was on Coumadin at the time for history of DVT. Comminuted was discontinued. She is currently on no anticoagulant medication. INR today was 1.17. NIH stroke score was 7, including moderately severe weakness proximally and distally of both lower extremities. She was not deemed a candidate for TPA because of her recent gastrointestinal hemorrhage as well as mild deficits considered to be acute. LSN: 1545 on 04/24/2015 tPA Given: No: As stated above  MRI scan of the brain showed a 2.9 mm focus of high signal intensity on the diffusion-weighted images in the high right posterior frontal region suspicious for an acute cortical infarct. Artifact from dural calcification was felt to be less likely. Carotid ultrasound showed 40-59% right ICA stenosis. Transthoracic echo showed normal ejection fraction. Trans-cranial Doppler study was technically limited due to artifact from poor to absent windows. CT) of the neck showed moderate carotid atherosclerosis with 8 mm flap in the proximal left ICA which may represent a short segment dissection and possibly tiny Weber flap in the proximal right ICA is well. LDL cholesterol was 69 mg percent and hemoglobin A1c was 8.0.  Patient had mild dysarthria and left facial droop. Patient had recent GI bleeding and anemia and hence warfarin was on hold. [name] is accompanying her today feels that he did not actually notice significant new focal findings and patient has had left facial droop asymmetry and droop which is a chronic finding. She has had chronic confusion and memory difficulties from dementia for the last couple of years. He did not notice that she was significantly worse but the assisted living facility where she lives thought she was changed and hence sent her to the hospital. The patient did in fact has been seen by Dr. Arbutus Leas neurologist in 2015 and diagnosed with dementia and multifactorial gait abnormality due to diabetic neuropathy, arthritis and vascular dementia and started on Aricept. She was taking only 5 mg daily which is tolerating well without side effects. The son admits that he noticed some initial improvement in her cognition but that seems to have improved. She is currently living in assisted living and requires a lot of help. She is able to transfer to a wheelchair but requires a two-person assist to stand. She does not ambulate significantly. Patient was switched from aspirin to eliquis at the last hospital admission and seems to be tolerating well without significant bleeding or bruising. She has a history history of atrial fibrillation which is rate controlled ROS:   14 system review of systems is positive for  fatigue, leg swelling, hearing loss, loss of vision, shortness of breath, cough, wheezing, snoring, incontinence, joint pain, allergies, memory loss, confusion, weakness, slurred speech, tremor, anxiety and depression  PMH:  Past Medical History  Diagnosis Date  . Stroke (HCC)   . Neuropathy (HCC)   . Swelling   . General weakness   . CHF (congestive heart failure) (HCC)   . CPAP (continuous positive airway pressure) dependence   . Diabetes mellitus without complication (HCC)   .  Allergic rhinitis   . Hyperlipidemia   . Venous insufficiency   . Urinary incontinence   . Anxiety   . Depression   . Osteoarthritis   . COPD (chronic obstructive pulmonary disease) (HCC)   . DVT (deep venous thrombosis) (HCC)   . PE (pulmonary embolism)     Social History:  Social History   Social History  . Marital Status: Widowed    Spouse Name: N/A  . Number of Children: N/A  . Years of Education: N/A   Occupational History  . retired     Print production planner   Social History Main Topics  . Smoking status: Former Games developer  . Smokeless tobacco: Never Used     Comment: quit 1991  . Alcohol Use: No  . Drug Use: No  . Sexual Activity: No   Other Topics Concern  . Not on file   Social History Narrative    Medications:   Current Outpatient Prescriptions on File Prior to Visit  Medication Sig Dispense Refill  . acetaminophen (TYLENOL) 325 MG tablet Take 650 mg by mouth 3 (three) times daily.    [name] albuterol (PROVENTIL HFA;VENTOLIN HFA) 108 (90 BASE) MCG/ACT inhaler Inhale 2 puffs into the lungs every 6 (six) hours as needed for wheezing or shortness of breath.    [name] apixaban (ELIQUIS) 5 MG TABS tablet Take 1 tablet (5 mg total) by mouth 2 (two) times daily. 60 tablet 0  . cholecalciferol (VITAMIN D) 1000 UNITS tablet Take 1,000 Units by mouth every morning.     . Cranberry 450 MG TABS Take 1 tablet by mouth 2 (two) times daily.    . diclofenac sodium (VOLTAREN) 1 % GEL Apply 2 g topically 2 (two) times daily.    [name] escitalopram (LEXAPRO) 20 MG tablet Take 20 mg by mouth daily with breakfast.    . ferrous sulfate 325 (65 FE) MG tablet Take 325 mg by mouth daily with breakfast.    . fexofenadine (ALLEGRA) 60 MG tablet Take 60 mg by mouth daily with breakfast.    . fluticasone (FLONASE) 50 MCG/ACT nasal spray Place 1 spray into both nostrils at bedtime.    . Fluticasone-Salmeterol (ADVAIR) 250-50 MCG/DOSE AEPB Inhale 1 puff into the lungs 2 (two) times daily. Rinse and spit after  each use.    . furosemide (LASIX) 40 MG tablet Take 1 tablet (40 mg total) by mouth daily with breakfast. 30 tablet   . insulin glargine (LANTUS) 100 UNIT/ML injection Inject 0.35 mLs (35 Units total) into the skin 2 (two) times daily. Takes 30 units in the morning and 28 units at bedtime 10 mL 11  . insulin lispro (HUMALOG KWIKPEN) 100 UNIT/ML KiwkPen Inject 20 Units into the skin 3 (three) times daily.    [name] levothyroxine (SYNTHROID, LEVOTHROID) 25 MCG tablet Take 25 mcg by mouth every evening.     [name] lisinopril (PRINIVIL,ZESTRIL) 5 MG tablet Take 1 tablet (5 mg total) by mouth daily with breakfast. Resume on 04/12/2015    . simvastatin (ZOCOR) 5 MG tablet Take 5 mg by mouth at bedtime.    . sucralfate (CARAFATE) 1 G tablet Take 1 g by mouth at bedtime.    [name] tiotropium (SPIRIVA) 18  MCG inhalation capsule Place 18 mcg into inhaler and inhale at bedtime.    . traMADol (ULTRAM) 50 MG tablet Take 50 mg by mouth 2 (two) times daily as needed (for pain).      No current facility-administered medications on file prior to visit.    Allergies:   Allergies  Allergen Reactions  . Pollen Extract Other (See Comments)    unknown    Physical Exam General: Obese elderly Caucasian lady seated, in no evident distress Head: head normocephalic and atraumatic.  Neck: supple with no carotid or supraclavicular bruits Cardiovascular: regular rate and rhythm, no murmurs Musculoskeletal: no deformity Skin:  no rash/petichiae Vascular:  Normal pulses all extremities Filed Vitals:   07/17/15 1323  BP: 119/65  Pulse: 65   Neurologic Exam Mental Status: Awake and fully alert. Oriented to place and time. Recent and remote memory poor . Attention span, concentration and fund of knowledge diminished Mood and affect appropriate. Mini-Mental status exam scored 22/30. Clock drawing 1/4. Animal naming 8. Cranial Nerves: Fundoscopic exam reveals sharp disc margins. Pupils equal, briskly reactive to light. Extraocular  movements full without nystagmus. Visual fields full to confrontation. Hearing intact. Facial sensation intact. Mild left lower facial asymmetry., tongue, palate moves normally and symmetrically.  Motor: Normal bulk and tone. Normal strength in all tested extremity muscles. Mild action tremor of the right upper extremity when outstretched. No cogwheel rigidity. Sensory.: intact to touch ,pinprick .position and vibratory sensation.  Coordination: Rapid alternating movements normal in all extremities. Finger-to-nose and heel-to-shin performed accurately bilaterally. Gait and Station: Unable to arise from a chair without a 2 person assist hence gait deferred.  Reflexes: 1+ and symmetric. Toes downgoing.      ASSESSMENT: 82 year old lady with long-standing history of mild dementia with recent admission for left facial droop and left-sided weakness and confusion likely from a small right parietal cortical infarct of Embolic etiology from atrial fibrillation.    PLAN: I had a long discussion with the patient and husband regarding her recent small embolic stroke related to atrial fibrillation. I discussed risk for recurrent strokes, stroke prevention strategies and answered questions. Continue eliquis for secondary stroke prevention with strict control of diabetes with hemoglobin A1c goal below 6.5, hypertension with blood pressure goal below 130/90 and lipids with LDL cholesterol goal below 70 mg percent. Patient also has baseline dementia and is on a suboptimal dose of Aricept hence I recommend increasing it to 10 mg daily if tolerated. I have discussed possible side effects with the patient and husband and advised him to call me if necessary. Continue ongoing therapies.Greater than 50% of time during this 50 minute prolonged visit which involved medical decision making of high complexity was spent on counseling,explanation of diagnosis, planning of further management, discussion with patient and family  and coordination of care Return for follow-up in the future in 3 months with nurse practitioner or call earlier if necessary.  Delia Heady, MD Note: This document was prepared with digital dictation and possible smart phrase technology. Any transcriptional errors that result from this process are unintentional

## 2015-09-26 ENCOUNTER — Telehealth: Payer: Self-pay

## 2015-09-26 ENCOUNTER — Other Ambulatory Visit: Payer: Self-pay | Admitting: Neurology

## 2015-09-26 ENCOUNTER — Other Ambulatory Visit: Payer: Self-pay

## 2015-09-26 MED ORDER — DONEPEZIL HCL 10 MG PO TABS: 10.0000 mg | ORAL_TABLET | Freq: Every day | ORAL | Status: AC

## 2015-09-26 NOTE — Telephone Encounter (Signed)
Rn call [facility] and he receive the [employer]  Prescription, and [facility] form for the pharmacy. Thayer OhmChris stated [employer] only fax the cover and not the other forms. Thayer OhmChris stated he receive everything and fax the prescription and [employer] form filled out and sign back to them.

## 2015-09-26 NOTE — Progress Notes (Signed)
Prescription of aricept 10mg  at bedtime and [facility] [place] fax to [phone].Fax confirm and receive twice.

## 2015-09-26 NOTE — Telephone Encounter (Signed)
Rn call Hormel FoodsChris Whitesett [facility] at [phone]. Rn stated a form was receive from him to have Dr.Sethi sign for aricept prescription. Rn stated a fax was receive from their pharmacy [facility] stating pts insurance will only pay for 10mg  of aricpet, not 5mg  tablets.Thayer Ohm.Courtney stated the pharmacy did not send them the form. Rn explain the [employer] form,and new change in dose prescription was fax twice this morning. Thayer OhmChris wanted a copy of the forms that was fax this am.

## 2015-09-26 NOTE — Telephone Encounter (Signed)
Made in error

## 2015-10-16 ENCOUNTER — Encounter: Payer: Self-pay | Admitting: Nurse Practitioner

## 2015-10-16 ENCOUNTER — Ambulatory Visit (INDEPENDENT_AMBULATORY_CARE_PROVIDER_SITE_OTHER): Payer: Medicare Other | Admitting: Nurse Practitioner

## 2015-10-16 VITALS — BP 115/58 | HR 70 | Ht 66.5 in

## 2015-10-16 DIAGNOSIS — F0390 Unspecified dementia without behavioral disturbance: Secondary | ICD-10-CM

## 2015-10-16 DIAGNOSIS — E785 Hyperlipidemia, unspecified: Secondary | ICD-10-CM | POA: Diagnosis not present

## 2015-10-16 DIAGNOSIS — E1169 Type 2 diabetes mellitus with other specified complication: Secondary | ICD-10-CM | POA: Diagnosis not present

## 2015-10-16 DIAGNOSIS — I10 Essential (primary) hypertension: Secondary | ICD-10-CM | POA: Diagnosis not present

## 2015-10-16 DIAGNOSIS — I639 Cerebral infarction, unspecified: Secondary | ICD-10-CM | POA: Diagnosis not present

## 2015-10-16 DIAGNOSIS — E1159 Type 2 diabetes mellitus with other circulatory complications: Secondary | ICD-10-CM

## 2015-10-16 NOTE — Patient Instructions (Addendum)
Per skilled nursing sheet 

## 2015-10-16 NOTE — Progress Notes (Signed)
[facility]  PATIENT: Courtney Montgomery DOB: 10/18/1927   REASON FOR VISIT: Follow-up for stroke with risk factors of hypertension previous stroke hyperlipidemia 4 year history of dementia HISTORY FROM: Patient and son    HISTORY OF PRESENT ILLNESS: UPDATE 10/16/2015 [name], 82 year old female returns for stroke follow-up. She also has a history of hypertension and previous stroke diabetes hyperlipidemia and dementia. She currently resides at [facility]. She requires a lot of help. She is able to transfer to a wheelchair but requires a two-person assist to stand. She does not ambulate but stays in wheelchair according to the son. She has not had further stroke or TIA symptoms she is currently on Eloquis. Marland KitchenShe was diagnosed with dementia and multifactorial gait abnormality due to diabetic neuropathy, arthritis and vascular dementia and started on Aricept in 2015 by Dr. Arbutus Leas. She returns for reevaluation    History 2/14/2017PSMs Swor is seen today for follow up after recent hospital admission for stroke in November 2016 Courtney Montgomery is an 82 y.o. female with a history of hypertension, previous stroke, diabetes mellitus and hyperlipidemia as well as dementia, to the emergency room and code stroke status after developing left facial weakness and speech. She was last known well at 1545 today. She was also noted to be somewhat more confused than usual. CT scan of the head showed no acute intracranial abnormality. Patient was hospitalized on 04/03/2015 4 acute GI bleed. She was on Coumadin at the time for history of DVT. Comminuted was discontinued. She is currently on no anticoagulant medication. INR today was 1.17. NIH stroke score was 7, including moderately severe weakness proximally and distally of both lower extremities. She was not deemed a candidate for TPA because of her recent gastrointestinal hemorrhage as well as mild deficits considered to be acute. LSN:  [id number] on 04/24/2015 tPA Given: No: As stated above MRI scan of the brain showed a 2.9 mm focus of high signal intensity on the diffusion-weighted images in the high right posterior frontal region suspicious for an acute cortical infarct. Artifact from dural calcification was felt to be less likely. Carotid ultrasound showed 40-59% right ICA stenosis. Transthoracic echo showed normal ejection fraction. Trans-cranial Doppler study was technically limited due to artifact from poor to absent windows. CT) of the neck showed moderate carotid atherosclerosis with 8 mm flap in the proximal left ICA which may represent a short segment dissection and possibly tiny Weber flap in the proximal right ICA is well. LDL cholesterol was 69 mg percent and hemoglobin A1c was 8.0. Patient had mild dysarthria and left facial droop. Patient had recent GI bleeding and anemia and hence warfarin was on hold. [name] is accompanying her today feels that he did not actually notice significant new focal findings and patient has had left facial droop asymmetry and droop which is a chronic finding. She has had chronic confusion and memory difficulties from dementia for the last couple of years. He did not notice that she was significantly worse but the assisted living facility where she lives thought she was changed and hence sent her to the hospital. The patient did in fact has been seen by Dr. Arbutus Leas neurologist in 2015 and diagnosed with dementia and multifactorial gait abnormality due to diabetic neuropathy, arthritis and vascular dementia and started on Aricept. She was taking only 5 mg daily which is tolerating well without side effects. The son admits that he noticed some initial improvement in her cognition but that seems to have improved. She  is currently living in assisted living and requires a lot of help. She is able to transfer to a wheelchair but requires a two-person assist to stand. She does not ambulate significantly.  Patient was switched from aspirin to eliquis at the last hospital admission and seems to be tolerating well without significant bleeding or bruising. She has a history history of atrial fibrillation which is rate controlled  REVIEW OF SYSTEMS: Full 14 system review of systems performed and notable only for those listed, all others are neg:  Constitutional: neg  Cardiovascular: neg Ear/Nose/Throat: neg  Skin: neg Eyes: neg Respiratory: neg Gastroitestinal: neg  Hematology/Lymphatic: neg  Endocrine: neg Musculoskeletal:neg Allergy/Immunology: neg Neurological: neg Psychiatric: neg Sleep : neg   ALLERGIES: Allergies  Allergen Reactions  . Pollen Extract Other (See Comments)    unknown    HOME MEDICATIONS: Outpatient Prescriptions Prior to Visit  Medication Sig Dispense Refill  . acetaminophen (TYLENOL) 325 MG tablet Take 650 mg by mouth 3 (three) times daily.    [name] albuterol (PROVENTIL HFA;VENTOLIN HFA) 108 (90 BASE) MCG/ACT inhaler Inhale 2 puffs into the lungs every 6 (six) hours as needed for wheezing or shortness of breath.    [name] apixaban (ELIQUIS) 5 MG TABS tablet Take 1 tablet (5 mg total) by mouth 2 (two) times daily. 60 tablet 0  . cholecalciferol (VITAMIN D) 1000 UNITS tablet Take 1,000 Units by mouth every morning.     . Cranberry 450 MG TABS Take 1 tablet by mouth 2 (two) times daily.    . diclofenac sodium (VOLTAREN) 1 % GEL Apply 2 g topically 2 (two) times daily.    [name] donepezil (ARICEPT) 10 MG tablet Take 1 tablet (10 mg total) by mouth at bedtime. 90 tablet 1  . escitalopram (LEXAPRO) 20 MG tablet Take 20 mg by mouth daily with breakfast.    . ferrous sulfate 325 (65 FE) MG tablet Take 325 mg by mouth daily with breakfast.    . fexofenadine (ALLEGRA) 60 MG tablet Take 60 mg by mouth daily with breakfast.    . fluticasone (FLONASE) 50 MCG/ACT nasal spray Place 1 spray into both nostrils at bedtime.    . Fluticasone-Salmeterol (ADVAIR) 250-50 MCG/DOSE AEPB Inhale 1  puff into the lungs 2 (two) times daily. Rinse and spit after each use.    . furosemide (LASIX) 40 MG tablet Take 1 tablet (40 mg total) by mouth daily with breakfast. 30 tablet   . insulin glargine (LANTUS) 100 UNIT/ML injection Inject 0.35 mLs (35 Units total) into the skin 2 (two) times daily. Takes 30 units in the morning and 28 units at bedtime 10 mL 11  . insulin lispro (HUMALOG KWIKPEN) 100 UNIT/ML KiwkPen Inject 20 Units into the skin 3 (three) times daily.    [name] levothyroxine (SYNTHROID, LEVOTHROID) 25 MCG tablet Take 25 mcg by mouth every evening.     [name] lisinopril (PRINIVIL,ZESTRIL) 5 MG tablet Take 1 tablet (5 mg total) by mouth daily with breakfast. Resume on 04/12/2015    . nystatin (MYCOSTATIN/NYSTOP) 100000 UNIT/GM POWD     . omeprazole (PRILOSEC) 20 MG capsule     . simvastatin (ZOCOR) 5 MG tablet Take 5 mg by mouth at bedtime.    . sucralfate (CARAFATE) 1 G tablet Take 1 g by mouth at bedtime.    [name] tiotropium (SPIRIVA) 18 MCG inhalation capsule Place 18 mcg into inhaler and inhale at bedtime.    . traMADol (ULTRAM) 50 MG tablet Take 50 mg by mouth 2 (two)  times daily as needed (for pain).      No facility-administered medications prior to visit.    PAST MEDICAL HISTORY: Past Medical History  Diagnosis Date  . Stroke (HCC)   . Neuropathy (HCC)   . Swelling   . General weakness   . CHF (congestive heart failure) (HCC)   . CPAP (continuous positive airway pressure) dependence   . Diabetes mellitus without complication (HCC)   . Allergic rhinitis   . Hyperlipidemia   . Venous insufficiency   . Urinary incontinence   . Anxiety   . Depression   . Osteoarthritis   . COPD (chronic obstructive pulmonary disease) (HCC)   . DVT (deep venous thrombosis) (HCC)   . PE (pulmonary embolism)     PAST SURGICAL HISTORY: Past Surgical History  Procedure Laterality Date  . Cholecystectomy    . Appendectomy    . Cataract extraction, bilateral    . Esophagogastroduodenoscopy  (egd) with propofol N/A 04/05/2015    Procedure: ESOPHAGOGASTRODUODENOSCOPY (EGD) WITH PROPOFOL;  Surgeon: Graylin ShiverSalem F Ganem, MD;  Location: [facility];  Service: Endoscopy;  Laterality: N/A;    FAMILY HISTORY: History reviewed. No pertinent family history.  SOCIAL HISTORY: Social History   Social History  . Marital Status: Widowed    Spouse Name: N/A  . Number of Children: N/A  . Years of Education: N/A   Occupational History  . retired     Print production planneroffice manager   Social History Main Topics  . Smoking status: Former Games developermoker  . Smokeless tobacco: Never Used     Comment: quit 1991  . Alcohol Use: No  . Drug Use: No  . Sexual Activity: No   Other Topics Concern  . Not on file   Social History Narrative     PHYSICAL EXAM  Filed Vitals:   10/16/15 1310  BP: 115/58  Pulse: 70  Height: 5' 6.5" (1.689 m)   There is no weight on file to calculate BMI. General: Obese elderly Caucasian lady seated, in no evident distress Head: head normocephalic and atraumatic.  Neck: supple with no carotid bruits Cardiovascular: regular rate and rhythm, no murmurs Musculoskeletal: no deformity Skin: no rash/petichiae Vascular: Normal pulses all extremities    Neurological examination   Mentation: Awake and fully alert. Oriented to place and time. Recent and remote memory poor . Attention span, concentration and fund of knowledge diminished Mood and affect appropriate. Mini-Mental status exam scored 19/30. Clock drawing 0/4. Animal naming 4.. Cranial Nerves: Fundoscopic exam reveals sharp disc margins. Pupils equal, briskly reactive to light. Extraocular movements full without nystagmus. Visual fields full to confrontation. Hearing intact. Facial sensation intact. Mild left lower facial asymmetry., tongue, palate moves normally and symmetrically.  Motor: Normal bulk and tone. Normal strength in all tested extremity muscles. Mild action tremor of the right upper extremity when outstretched. No  cogwheel rigidity. Sensory.: intact to touch , in the upper and lower extremities  Coordination: Rapid alternating movements normal in all extremities. Finger-to-nose and heel-to-shin performed accurately bilaterally. Gait and Station: Unable to arise from a chair without a 2 person assist hence gait deferred.  Reflexes: 1+ and symmetric. Toes downgoing.  DIAGNOSTIC DATA (LABS, IMAGING, TESTING) - I reviewed patient records, labs, notes, testing and imaging myself where available.  Lab Results  Component Value Date   WBC 15.8* 04/24/2015   HGB 10.5* 04/24/2015   HCT 31.0* 04/24/2015   MCV 81.9 04/24/2015   PLT 200 04/24/2015      Component Value Date/Time  NA 137 04/26/2015 0418   K 3.6 04/26/2015 0418   CL 103 04/26/2015 0418   CO2 25 04/26/2015 0418   GLUCOSE 138* 04/26/2015 0418   BUN 22* 04/26/2015 0418   CREATININE 1.26* 04/26/2015 0418   CALCIUM 9.2 04/26/2015 0418   PROT 7.5 04/24/2015 1655   ALBUMIN 3.4* 04/24/2015 1655   AST 14* 04/24/2015 1655   ALT 11* 04/24/2015 1655   ALKPHOS 56 04/24/2015 1655   BILITOT 0.4 04/24/2015 1655   GFRNONAA 38* 04/26/2015 0418   GFRAA 44* 04/26/2015 0418   Lab Results  Component Value Date   CHOL 144 04/25/2015   HDL 30* 04/25/2015   LDLCALC 69 04/25/2015   TRIG 227* 04/25/2015   CHOLHDL 4.8 04/25/2015   Lab Results  Component Value Date   HGBA1C 7.9* 04/25/2015   No results found for: [id number] Lab Results  Component Value Date   TSH 1.675 04/04/2015      ASSESSMENT AND PLAN 82 year old lady with long-standing history of  dementia with recent admission for left facial droop and left-sided weakness and confusion likely from a small right parietal cortical infarct of Embolic etiology from atrial fibrillation.The patient is a current patient of Dr. Pearlean Brownie who is out of the office today . This note is sent to the work in doctor.     PLAN:Management of stroke risk factors  Embolic stroke related to atrial fibrillation.  Continue eliquis for secondary stroke prevention  strict control of diabetes with hemoglobin A1c goal below 6.5, continue insulin as directed hypertension with blood pressure goal below 130/90 today's reading 115/58 lipids with LDL cholesterol goal below 70 mg percent. Continue Zocor Dementia continue Aricept 10 mg daily Greater than 50% of time during this 30 minute  visit which involved discussion with son planning of further management, discussion with patient and son and coordination of care . If stable at next visit in 6 months will discharge back to primary care Nilda Riggs, [facility], [facility], APRN  [facility] [address], [address] [place], [place] [address] [phone]

## 2015-10-16 NOTE — Progress Notes (Signed)
I have read the note, and I agree with the clinical assessment and plan.  Kash Davie KEITH   

## 2015-11-05 ENCOUNTER — Encounter (HOSPITAL_COMMUNITY): Payer: Self-pay | Admitting: Emergency Medicine

## 2015-11-05 ENCOUNTER — Emergency Department (HOSPITAL_COMMUNITY)
Admission: EM | Admit: 2015-11-05 | Discharge: 2015-11-05 | Disposition: A | Discharge status: Home / Self Care | Payer: Medicare Other | Attending: Emergency Medicine | Admitting: Emergency Medicine

## 2015-11-05 DIAGNOSIS — Z794 Long term (current) use of insulin: Secondary | ICD-10-CM | POA: Diagnosis not present

## 2015-11-05 DIAGNOSIS — Z791 Long term (current) use of non-steroidal anti-inflammatories (NSAID): Secondary | ICD-10-CM | POA: Diagnosis not present

## 2015-11-05 DIAGNOSIS — Z7901 Long term (current) use of anticoagulants: Secondary | ICD-10-CM | POA: Diagnosis not present

## 2015-11-05 DIAGNOSIS — Z9981 Dependence on supplemental oxygen: Secondary | ICD-10-CM | POA: Diagnosis not present

## 2015-11-05 DIAGNOSIS — Z8669 Personal history of other diseases of the nervous system and sense organs: Secondary | ICD-10-CM | POA: Insufficient documentation

## 2015-11-05 DIAGNOSIS — M1990 Unspecified osteoarthritis, unspecified site: Secondary | ICD-10-CM | POA: Insufficient documentation

## 2015-11-05 DIAGNOSIS — E785 Hyperlipidemia, unspecified: Secondary | ICD-10-CM | POA: Insufficient documentation

## 2015-11-05 DIAGNOSIS — Z79899 Other long term (current) drug therapy: Secondary | ICD-10-CM | POA: Insufficient documentation

## 2015-11-05 DIAGNOSIS — J449 Chronic obstructive pulmonary disease, unspecified: Secondary | ICD-10-CM | POA: Insufficient documentation

## 2015-11-05 DIAGNOSIS — Z87891 Personal history of nicotine dependence: Secondary | ICD-10-CM | POA: Insufficient documentation

## 2015-11-05 DIAGNOSIS — F419 Anxiety disorder, unspecified: Secondary | ICD-10-CM | POA: Diagnosis not present

## 2015-11-05 DIAGNOSIS — Y9389 Activity, other specified: Secondary | ICD-10-CM | POA: Diagnosis not present

## 2015-11-05 DIAGNOSIS — R2241 Localized swelling, mass and lump, right lower limb: Secondary | ICD-10-CM | POA: Diagnosis not present

## 2015-11-05 DIAGNOSIS — Z86718 Personal history of other venous thrombosis and embolism: Secondary | ICD-10-CM | POA: Diagnosis not present

## 2015-11-05 DIAGNOSIS — Z043 Encounter for examination and observation following other accident: Secondary | ICD-10-CM | POA: Diagnosis present

## 2015-11-05 DIAGNOSIS — Y998 Other external cause status: Secondary | ICD-10-CM | POA: Diagnosis not present

## 2015-11-05 DIAGNOSIS — Z8673 Personal history of transient ischemic attack (TIA), and cerebral infarction without residual deficits: Secondary | ICD-10-CM | POA: Insufficient documentation

## 2015-11-05 DIAGNOSIS — W19XXXA Unspecified fall, initial encounter: Secondary | ICD-10-CM

## 2015-11-05 DIAGNOSIS — Y92128 Other place in nursing home as the place of occurrence of the external cause: Secondary | ICD-10-CM | POA: Diagnosis not present

## 2015-11-05 DIAGNOSIS — Z7951 Long term (current) use of inhaled steroids: Secondary | ICD-10-CM | POA: Diagnosis not present

## 2015-11-05 DIAGNOSIS — Z86711 Personal history of pulmonary embolism: Secondary | ICD-10-CM | POA: Insufficient documentation

## 2015-11-05 DIAGNOSIS — F329 Major depressive disorder, single episode, unspecified: Secondary | ICD-10-CM | POA: Insufficient documentation

## 2015-11-05 DIAGNOSIS — I509 Heart failure, unspecified: Secondary | ICD-10-CM | POA: Diagnosis not present

## 2015-11-05 DIAGNOSIS — E119 Type 2 diabetes mellitus without complications: Secondary | ICD-10-CM | POA: Diagnosis not present

## 2015-11-05 LAB — BASIC METABOLIC PANEL
ANION GAP: 7 (ref 5–15)
BUN: 22 mg/dL — ABNORMAL HIGH (ref 6–20)
CALCIUM: 9.7 mg/dL (ref 8.9–10.3)
CHLORIDE: 101 mmol/L (ref 101–111)
CO2: 28 mmol/L (ref 22–32)
Creatinine, Ser: 1.3 mg/dL — ABNORMAL HIGH (ref 0.44–1.00)
GFR calc non Af Amer: 36 mL/min — ABNORMAL LOW (ref >60)
GFR, EST AFRICAN AMERICAN: 42 mL/min — AB (ref >60)
Glucose, Bld: 159 mg/dL — ABNORMAL HIGH (ref 65–99)
Potassium: 4.1 mmol/L (ref 3.5–5.1)
Sodium: 136 mmol/L (ref 135–145)

## 2015-11-05 LAB — CBC
HCT: 29.6 % — ABNORMAL LOW (ref 36.0–46.0)
HEMOGLOBIN: 9.2 g/dL — AB (ref 12.0–15.0)
MCH: 28.5 pg (ref 26.0–34.0)
MCHC: 31.1 g/dL (ref 30.0–36.0)
MCV: 91.6 fL (ref 78.0–100.0)
Platelets: 212 10*3/uL (ref 150–400)
RBC: 3.23 MIL/uL — AB (ref 3.87–5.11)
RDW: 14.6 % (ref 11.5–15.5)
WBC: 10 10*3/uL (ref 4.0–10.5)

## 2015-11-05 NOTE — ED Notes (Signed)
Pt [facility] ED [facility] after fell from her wheel chair lying face down, pt unaware if any LOC she didn't remember how she fell from the wheel chair, pt denies any pain at this time. Pt is DNR pt on coumadin for blood clots, SR with frequent PAC on the monitor, BP 147/52, HR 62, R- 20, SPO2 96% on 4L [place], pt O2 dependent at night to sleep. CBG 170

## 2015-11-05 NOTE — Discharge Instructions (Signed)

## 2015-11-05 NOTE — ED Provider Notes (Signed)
CSN: [id number]     Arrival date & time 11/05/15  2100 History   First MD Initiated Contact with Patient 11/05/15 2115     Chief Complaint  Patient presents with  . Fall     (Consider location/radiation/quality/duration/timing/severity/associated sxs/prior Treatment) HPI   Courtney Montgomery is a 82 y.o. female who was found on floor by staff at her assisted living facility. She was facedown on the floor, complaining of pain in her nose and her left shoulder. They report that her lift chair was in the elevated position indicating that she fell while getting out of the chair. The patient is not supposed to get out of a better attempt to transfer without assistance. She has not had any other recent illnesses. Her son is with her now states that she has at her baseline. He agrees that she is not in pain after the patient states that she has no pain in her nose, head, neck, back, arms or legs. The patient is comfortable. There are no other known modifying factors.   Past Medical History  Diagnosis Date  . Stroke (HCC)   . Neuropathy (HCC)   . Swelling   . General weakness   . CHF (congestive heart failure) (HCC)   . CPAP (continuous positive airway pressure) dependence   . Diabetes mellitus without complication (HCC)   . Allergic rhinitis   . Hyperlipidemia   . Venous insufficiency   . Urinary incontinence   . Anxiety   . Depression   . Osteoarthritis   . COPD (chronic obstructive pulmonary disease) (HCC)   . DVT (deep venous thrombosis) (HCC)   . PE (pulmonary embolism)    Past Surgical History  Procedure Laterality Date  . Cholecystectomy    . Appendectomy    . Cataract extraction, bilateral    . Esophagogastroduodenoscopy (egd) with propofol N/A 04/05/2015    Procedure: ESOPHAGOGASTRODUODENOSCOPY (EGD) WITH PROPOFOL;  Surgeon: Graylin ShiverSalem F Ganem, MD;  Location: [facility];  Service: Endoscopy;  Laterality: N/A;   No family history on file. Social History  Substance Use Topics  .  Smoking status: Former Games developermoker  . Smokeless tobacco: Never Used     Comment: quit 1991  . Alcohol Use: No   OB History    No data available     Review of Systems  All other systems reviewed and are negative.     Allergies  Pollen extract  Home Medications   Prior to Admission medications   Medication Sig Start Date End Date Taking? Authorizing Provider  acetaminophen (TYLENOL) 325 MG tablet Take 650 mg by mouth 3 (three) times daily.    Historical Provider, MD  albuterol (PROVENTIL HFA;VENTOLIN HFA) 108 (90 BASE) MCG/ACT inhaler Inhale 2 puffs into the lungs every 6 (six) hours as needed for wheezing or shortness of breath.    Historical Provider, MD  apixaban (ELIQUIS) 5 MG TABS tablet Take 1 tablet (5 mg total) by mouth 2 (two) times daily. 04/26/15   Marinda ElkAbraham Feliz Ortiz, MD  cholecalciferol (VITAMIN D) 1000 UNITS tablet Take 1,000 Units by mouth every morning.     Historical Provider, MD  Cranberry 450 MG TABS Take 1 tablet by mouth 2 (two) times daily.    Historical Provider, MD  diclofenac sodium (VOLTAREN) 1 % GEL Apply 2 g topically 2 (two) times daily.    Historical Provider, MD  donepezil (ARICEPT) 10 MG tablet Take 1 tablet (10 mg total) by mouth at bedtime. 09/26/15   Micki RileyPramod S Sethi, MD  escitalopram (LEXAPRO) 20 MG tablet Take 20 mg by mouth daily with breakfast.    Historical Provider, MD  ferrous sulfate 325 (65 FE) MG tablet Take 325 mg by mouth daily with breakfast.    Historical Provider, MD  fexofenadine (ALLEGRA) 60 MG tablet Take 60 mg by mouth daily with breakfast.    Historical Provider, MD  fluconazole (DIFLUCAN) 150 MG tablet Take 150 mg by mouth. Daily on Saturday.    Historical Provider, MD  fluticasone (FLONASE) 50 MCG/ACT nasal spray Place 1 spray into both nostrils at bedtime.    Historical Provider, MD  Fluticasone-Salmeterol (ADVAIR) 250-50 MCG/DOSE AEPB Inhale 1 puff into the lungs 2 (two) times daily. Rinse and spit after each use.    Historical  Provider, MD  furosemide (LASIX) 40 MG tablet Take 1 tablet (40 mg total) by mouth daily with breakfast. 04/09/15   Rodolph Bong, MD  insulin glargine (LANTUS) 100 UNIT/ML injection Inject 0.35 mLs (35 Units total) into the skin 2 (two) times daily. Takes 30 units in the morning and 28 units at bedtime 04/26/15   Marinda Elk, MD  insulin lispro (HUMALOG KWIKPEN) 100 UNIT/ML KiwkPen Inject 20 Units into the skin 3 (three) times daily.    Historical Provider, MD  levothyroxine (SYNTHROID, LEVOTHROID) 25 MCG tablet Take 25 mcg by mouth every evening.     Historical Provider, MD  lisinopril (PRINIVIL,ZESTRIL) 5 MG tablet Take 1 tablet (5 mg total) by mouth daily with breakfast. Resume on 04/12/2015 04/12/15   Rodolph Bong, MD  nystatin (MYCOSTATIN/NYSTOP) 100000 UNIT/GM POWD  06/24/15   Historical Provider, MD  Olopatadine HCl (PATADAY) 0.2 % SOLN Apply 1 drop to eye daily.    Historical Provider, MD  omeprazole (PRILOSEC) 20 MG capsule  06/19/15   Historical Provider, MD  simvastatin (ZOCOR) 5 MG tablet Take 5 mg by mouth at bedtime.    Historical Provider, MD  sucralfate (CARAFATE) 1 G tablet Take 1 g by mouth at bedtime.    Historical Provider, MD  tiotropium (SPIRIVA) 18 MCG inhalation capsule Place 18 mcg into inhaler and inhale at bedtime.    Historical Provider, MD  traMADol (ULTRAM) 50 MG tablet Take 50 mg by mouth 2 (two) times daily as needed (for pain).     Historical Provider, MD   BP 129/50 mmHg  Pulse 53  Temp(Src) 97.6 F (36.4 C) (Oral)  Resp 23  Ht 5' 6.5" (1.689 m)  Wt 260 lb (117.935 kg)  BMI 41.34 kg/m2  SpO2 98% Physical Exam  Constitutional: She is oriented to person, place, and time. She appears well-developed. No distress.  Elderly, frail  HENT:  Head: Normocephalic and atraumatic.  Right Ear: External ear normal.  Left Ear: External ear normal.  Eyes: Conjunctivae and EOM are normal. Pupils are equal, round, and reactive to light.  Neck: Normal range  of motion and phonation normal. Neck supple.  Cardiovascular: Normal rate, regular rhythm and normal heart sounds.   Pulmonary/Chest: Effort normal and breath sounds normal. She exhibits no bony tenderness.  Abdominal: Soft. There is no tenderness.  Musculoskeletal: Normal range of motion.  Mild swelling, right lower leg. Patient's son states this is chronic. Normal active range of motion, arms and legs bilaterally.  Neurological: She is alert and oriented to person, place, and time. No cranial nerve deficit or sensory deficit. She exhibits normal muscle tone. Coordination normal.  Skin: Skin is warm, dry and intact.  Psychiatric: She has a normal mood and affect.  Her behavior is normal.  Nursing note and vitals reviewed.   ED Course  Procedures (including critical care time)  Medications - No data to display  Patient Vitals for the past 24 hrs:  BP Temp Temp src Pulse Resp SpO2 Height Weight  11/05/15 2338 145/56 mmHg 98 F (36.7 C) - 65 19 96 % - -  11/05/15 2200 (!) 142/45 mmHg - - 75 20 94 % - -  11/05/15 2145 146/67 mmHg - - (!) 53 21 97 % - -  11/05/15 2130 (!) 129/50 mmHg - - (!) 53 23 98 % - -  11/05/15 2115 135/78 mmHg 97.6 F (36.4 C) Oral (!) 55 18 100 % - -  11/05/15 2105 - - - - - - 5' 6.5" (1.689 m) 260 lb (117.935 kg)  11/05/15 2100 - - - - - 96 % - -    At discharge -Reevaluation with update and discussion. After initial assessment and treatment, an updated evaluation reveals no further complaints. Patient is comfortable. Findings discussed with patient and son, who is with her. Joesphine Schemm L    Labs Review Labs Reviewed  BASIC METABOLIC PANEL - Abnormal; Notable for the following:    Glucose, Bld 159 (*)    BUN 22 (*)    Creatinine, Ser 1.30 (*)    GFR calc non Af Amer 36 (*)    GFR calc Af Amer 42 (*)    All other components within normal limits  CBC - Abnormal; Notable for the following:    RBC 3.23 (*)    Hemoglobin 9.2 (*)    HCT 29.6 (*)    All  other components within normal limits  CBG MONITORING, ED    Imaging Review No results found. I have personally reviewed and evaluated these images and lab results as part of my medical decision-making.   EKG Interpretation None      MDM   Final diagnoses:  Fall, initial encounter    Fall, without evident injury. Suspect mechanical fall related to her chronic disability. Doubt new infectious or metabolic process.   Nursing Notes Reviewed/ Care Coordinated Applicable Imaging Reviewed Interpretation of Laboratory Data incorporated into ED treatment  The patient appears reasonably screened and/or stabilized for discharge and I doubt any other medical condition or other [facility] requiring further screening, evaluation, or treatment in the ED at this time prior to discharge.  Plan: Home Medications- usual; Home Treatments- rest; return here if the recommended treatment, does not improve the symptoms; Recommended follow up- PCP, when necessary    Mancel Bale, MD 11/05/15 2345

## 2016-04-17 ENCOUNTER — Ambulatory Visit: Payer: Medicare Other | Admitting: Nurse Practitioner

## 2016-04-22 ENCOUNTER — Ambulatory Visit (INDEPENDENT_AMBULATORY_CARE_PROVIDER_SITE_OTHER): Payer: Medicare Other | Admitting: Nurse Practitioner

## 2016-04-22 ENCOUNTER — Encounter: Payer: Self-pay | Admitting: Nurse Practitioner

## 2016-04-22 VITALS — BP 125/57 | HR 82 | Ht 66.5 in

## 2016-04-22 DIAGNOSIS — E1169 Type 2 diabetes mellitus with other specified complication: Secondary | ICD-10-CM

## 2016-04-22 DIAGNOSIS — I10 Essential (primary) hypertension: Secondary | ICD-10-CM

## 2016-04-22 DIAGNOSIS — F0390 Unspecified dementia without behavioral disturbance: Secondary | ICD-10-CM | POA: Diagnosis not present

## 2016-04-22 DIAGNOSIS — I639 Cerebral infarction, unspecified: Secondary | ICD-10-CM | POA: Diagnosis not present

## 2016-04-22 DIAGNOSIS — E785 Hyperlipidemia, unspecified: Secondary | ICD-10-CM

## 2016-04-22 NOTE — Progress Notes (Signed)
[facility]  PATIENT: Courtney Montgomery DOB: [birth year]   REASON FOR VISIT: Follow-up for stroke with risk factors of hypertension previous stroke hyperlipidemia 4 year history of dementia HISTORY FROM: Patient and son Courtney Montgomery    HISTORY OF PRESENT ILLNESS: UPDATE  11/21/2017CM Courtney Montgomery,  82 year old female returns for follow-up with her son .  She has a history of  admission for  stoke in November 2016 ,MRI scan of the brain showed a 2.9 mm focus of high signal intensity on the diffusion-weighted images in the high right posterior frontal region suspicious for an acute cortical infarct. She has not had further stroke or TIA symptoms and is presently on eliquis. She stays in a wheelchair most of the day, ambulates occasionally with a walker. She is a resident of [facility] and has a history of dementia for about 5 years. She also has a history of diabetic neuropathy. She returns for reevaluation  UPDATE 10/16/2015 CMMs.Courtney Montgomery, 82 year old female returns for stroke follow-up. She also has a history of hypertension and previous stroke diabetes hyperlipidemia and dementia. She currently resides at [facility]. She requires a lot of help. She is able to transfer to a wheelchair but requires a two-person assist to stand. She does not ambulate but stays in wheelchair according to the son. She has not had further stroke or TIA symptoms she is currently on Eloquis. Courtney KitchenShe was diagnosed with dementia and multifactorial gait abnormality due to diabetic neuropathy, arthritis and vascular dementia and started on Aricept in 2015 by Dr. Arbutus Leas. She returns for reevaluation    History 2/14/2017PSMs Mahaffy is seen today for follow up after recent hospital admission for stroke in November 2016 Courtney Montgomery is an 82 y.o. female with a history of hypertension, previous stroke, diabetes mellitus and hyperlipidemia as well as dementia, to the emergency room and code stroke status after  developing left facial weakness and speech. She was last known well at 1545 today. She was also noted to be somewhat more confused than usual. CT scan of the head showed no acute intracranial abnormality. Patient was hospitalized on 04/03/2015 4 acute GI bleed. She was on Coumadin at the time for history of DVT. Comminuted was discontinued. She is currently on no anticoagulant medication. INR today was 1.17. NIH stroke score was 7, including moderately severe weakness proximally and distally of both lower extremities. She was not deemed a candidate for TPA because of her recent gastrointestinal hemorrhage as well as mild deficits considered to be acute. LSN: [id number] on 04/24/2015 tPA Given: No: As stated above MRI scan of the brain showed a 2.9 mm focus of high signal intensity on the diffusion-weighted images in the high right posterior frontal region suspicious for an acute cortical infarct. Artifact from dural calcification was felt to be less likely. Carotid ultrasound showed 40-59% right ICA stenosis. Transthoracic echo showed normal ejection fraction. Trans-cranial Doppler study was technically limited due to artifact from poor to absent windows. CT) of the neck showed moderate carotid atherosclerosis with 8 mm flap in the proximal left ICA which may represent a short segment dissection and possibly tiny Weber flap in the proximal right ICA is well. LDL cholesterol was 69 mg percent and hemoglobin A1c was 8.0. Patient had mild dysarthria and left facial droop. Patient had recent GI bleeding and anemia and hence warfarin was on hold. [name] is accompanying her today feels that he did not actually notice significant new focal findings and patient has had left facial droop  asymmetry and droop which is a chronic finding. She has had chronic confusion and memory difficulties from dementia for the last couple of years. He did not notice that she was significantly worse but the assisted living facility  where she lives thought she was changed and hence sent her to the hospital. The patient did in fact has been seen by Dr. Arbutus Leasat neurologist in 2015 and diagnosed with dementia and multifactorial gait abnormality due to diabetic neuropathy, arthritis and vascular dementia and started on Aricept. She was taking only 5 mg daily which is tolerating well without side effects. The son admits that he noticed some initial improvement in her cognition but that seems to have improved. She is currently living in assisted living and requires a lot of help. She is able to transfer to a wheelchair but requires a two-person assist to stand. She does not ambulate significantly. Patient was switched from aspirin to eliquis at the last hospital admission and seems to be tolerating well without significant bleeding or bruising. She has a history history of atrial fibrillation which is rate controlled  REVIEW OF SYSTEMS: Full 14 system review of systems performed and notable only for those listed, all others are neg:  Constitutional: neg  Cardiovascular: neg Ear/Nose/Throat: neg  Skin: neg Eyes: neg Respiratory: neg Gastroitestinal: neg  Hematology/Lymphatic: neg  Endocrine: neg Musculoskeletal:neg Allergy/Immunology: neg Neurological: neg Psychiatric: neg Sleep : neg   ALLERGIES: Allergies  Allergen Reactions  . Pollen Extract Other (See Comments)    unknown    HOME MEDICATIONS: Outpatient Medications Prior to Visit  Medication Sig Dispense Refill  . acetaminophen (TYLENOL) 325 MG tablet Take 650 mg by mouth 3 (three) times daily.    Courtney Montgomery. albuterol (PROVENTIL HFA;VENTOLIN HFA) 108 (90 BASE) MCG/ACT inhaler Inhale 2 puffs into the lungs every 6 (six) hours as needed for wheezing or shortness of breath.    Courtney Montgomery. apixaban (ELIQUIS) 5 MG TABS tablet Take 1 tablet (5 mg total) by mouth 2 (two) times daily. 60 tablet 0  . cholecalciferol (VITAMIN D) 1000 UNITS tablet Take 1,000 Units by mouth every morning.     .  Cranberry 450 MG TABS Take 1 tablet by mouth 2 (two) times daily.    . diclofenac sodium (VOLTAREN) 1 % GEL Apply 2 g topically 2 (two) times daily.    Courtney Montgomery. donepezil (ARICEPT) 10 MG tablet Take 1 tablet (10 mg total) by mouth at bedtime. (Patient taking differently: Take 5 mg by mouth at bedtime. ) 90 tablet 1  . ferrous sulfate 325 (65 FE) MG tablet Take 325 mg by mouth daily with breakfast.    . fexofenadine (ALLEGRA) 60 MG tablet Take 60 mg by mouth daily with breakfast.    . fluconazole (DIFLUCAN) 150 MG tablet Take 150 mg by mouth. Daily on Saturday.    . fluticasone (FLONASE) 50 MCG/ACT nasal spray Place 1 spray into both nostrils at bedtime.    . Fluticasone-Salmeterol (ADVAIR) 250-50 MCG/DOSE AEPB Inhale 1 puff into the lungs 2 (two) times daily. Rinse and spit after each use.    . furosemide (LASIX) 40 MG tablet Take 1 tablet (40 mg total) by mouth daily with breakfast. 30 tablet   . insulin glargine (LANTUS) 100 UNIT/ML injection Inject 0.35 mLs (35 Units total) into the skin 2 (two) times daily. Takes 30 units in the morning and 28 units at bedtime (Patient taking differently: Inject into the skin 2 (two) times daily. Takes 42 units in the morning and 45 units  at bedtime) 10 mL 11  . insulin lispro (HUMALOG KWIKPEN) 100 UNIT/ML KiwkPen Inject 24 Units into the skin 3 (three) times daily.     Courtney Montgomery. levothyroxine (SYNTHROID, LEVOTHROID) 25 MCG tablet Take 25 mcg by mouth every evening.     Courtney Montgomery. lisinopril (PRINIVIL,ZESTRIL) 5 MG tablet Take 1 tablet (5 mg total) by mouth daily with breakfast. Resume on 04/12/2015    . nystatin (MYCOSTATIN/NYSTOP) 100000 UNIT/GM POWD     . Olopatadine HCl (PATADAY) 0.2 % SOLN Apply 1 drop to eye daily.    Courtney Montgomery. omeprazole (PRILOSEC) 20 MG capsule     . simvastatin (ZOCOR) 5 MG tablet Take 5 mg by mouth at bedtime.    Courtney Montgomery. tiotropium (SPIRIVA) 18 MCG inhalation capsule Place 18 mcg into inhaler and inhale at bedtime.    . traMADol (ULTRAM) 50 MG tablet Take 50 mg by mouth 2  (two) times daily as needed (for pain).     Courtney Montgomery. escitalopram (LEXAPRO) 20 MG tablet Take 20 mg by mouth daily with breakfast.    . sucralfate (CARAFATE) 1 G tablet Take 1 g by mouth at bedtime.     No facility-administered medications prior to visit.     PAST MEDICAL HISTORY: Past Medical History:  Diagnosis Date  . Allergic rhinitis   . Anxiety   . CHF (congestive heart failure) (HCC)   . COPD (chronic obstructive pulmonary disease) (HCC)   . CPAP (continuous positive airway pressure) dependence   . Depression   . Diabetes mellitus without complication (HCC)   . DVT (deep venous thrombosis) (HCC)   . General weakness   . Hyperlipidemia   . Neuropathy (HCC)   . Osteoarthritis   . PE (pulmonary embolism)   . Stroke (HCC)   . Swelling   . Urinary incontinence   . Venous insufficiency     PAST SURGICAL HISTORY: Past Surgical History:  Procedure Laterality Date  . APPENDECTOMY    . CATARACT EXTRACTION, BILATERAL    . CHOLECYSTECTOMY    . ESOPHAGOGASTRODUODENOSCOPY (EGD) WITH PROPOFOL N/A 04/05/2015   Procedure: ESOPHAGOGASTRODUODENOSCOPY (EGD) WITH PROPOFOL;  Surgeon: Graylin ShiverSalem F Ganem, MD;  Location: [facility];  Service: Endoscopy;  Laterality: N/A;    FAMILY HISTORY: History reviewed. No pertinent family history.  SOCIAL HISTORY: Social History   Social History  . Marital status: Widowed    Spouse name: N/A  . Number of children: N/A  . Years of education: N/A   Occupational History  . retired     Print production planneroffice manager   Social History Main Topics  . Smoking status: Former Games developermoker  . Smokeless tobacco: Never Used     Comment: quit 1991  . Alcohol use No  . Drug use: No  . Sexual activity: No   Other Topics Concern  . Not on file   Social History Narrative  . No narrative on file     PHYSICAL EXAM  Vitals:   04/22/16 0910  BP: (!) 125/57  Pulse: 82  Height: 5' 6.5" (1.689 m)   There is no height or weight on file to calculate BMI. General: Obese  elderly Caucasian lady seated, in no evident distress Head: head normocephalic and atraumatic.  Neck: supple with no carotid bruits Cardiovascular: regular rate and rhythm, no murmurs Musculoskeletal: no deformity Skin: no rash/petichiae Vascular: Normal pulses all extremities    Neurological examination   Mentation: Awake and fully alert. Oriented to place and time. Recent and remote memory poor . Attention span, concentration and fund  of knowledge diminished Mood and affect appropriate. Mini-Mental status exam scored 17/30. Clock drawing 1/4. Animal naming 7.. Cranial Nerves: Fundoscopic exam not done. Pupils equal, briskly reactive to light. Extraocular movements full without nystagmus. Visual fields full to confrontation. Hearing intact. Facial sensation intact. Mild left lower facial asymmetry., tongue, palate moves normally and symmetrically.  Motor: Normal bulk and tone. Normal strength in all tested extremity muscles. Mild action tremor of the right upper extremity when outstretched. No cogwheel rigidity. Sensory.: intact to touch , pinprick and vibratory, in the upper and lower extremities  Coordination: Rapid alternating movements normal in all extremities. Finger-to-nose  performed accurately bilaterally. Gait and Station: Unable to arise from a chair without a 2 person assist hence gait deferred.  Reflexes: 1+ and symmetric. Toes downgoing.  DIAGNOSTIC DATA (LABS, IMAGING, TESTING) - I reviewed patient records, labs, notes, testing and imaging myself where available.  Lab Results  Component Value Date   WBC 10.0 11/05/2015   HGB 9.2 (L) 11/05/2015   HCT 29.6 (L) 11/05/2015   MCV 91.6 11/05/2015   PLT 212 11/05/2015      Component Value Date/Time   NA 136 11/05/2015 2121   K 4.1 11/05/2015 2121   CL 101 11/05/2015 2121   CO2 28 11/05/2015 2121   GLUCOSE 159 (H) 11/05/2015 2121   BUN 22 (H) 11/05/2015 2121   CREATININE 1.30 (H) 11/05/2015 2121   CALCIUM 9.7  11/05/2015 2121   PROT 7.5 04/24/2015 1655   ALBUMIN 3.4 (L) 04/24/2015 1655   AST 14 (L) 04/24/2015 1655   ALT 11 (L) 04/24/2015 1655   ALKPHOS 56 04/24/2015 1655   BILITOT 0.4 04/24/2015 1655   GFRNONAA 36 (L) 11/05/2015 2121   GFRAA 42 (L) 11/05/2015 2121   Lab Results  Component Value Date   CHOL 144 04/25/2015   HDL 30 (L) 04/25/2015   LDLCALC 69 04/25/2015   TRIG 227 (H) 04/25/2015   CHOLHDL 4.8 04/25/2015   Lab Results  Component Value Date   HGBA1C 7.9 (H) 04/25/2015   No results found for: [id number] Lab Results  Component Value Date   TSH 1.675 04/04/2015      ASSESSMENT AND PLAN 82 year old lady with long-standing history of  dementia with admission 04/2015 for left facial droop and left-sided weakness and confusion likely from a small right parietal cortical infarct of Embolic etiology from atrial fibrillation. She remains on Eliquis and has not had further stroke or TIA symptoms since that time   PLAN:Management of stroke risk factors  Embolic stroke related to atrial fibrillation. Continue eliquis for secondary stroke prevention  strict control of diabetes with hemoglobin A1c goal below 6.5, continue insulin as directed hypertension with blood pressure goal below 130/90 today's reading 125/57 lipids with LDL cholesterol goal below 70 mg percent. Continue Zocor Dementia continue Aricept 10 mg daily Further stroke or TIA symptoms since November 2016  Will discharge back to primary care, son is in agreement Nilda Riggs, [facility], [facility], APRN  [facility] [address], [address] [place], [place] [address] [phone]

## 2016-04-22 NOTE — Progress Notes (Signed)
I agree with the above plan 

## 2016-04-22 NOTE — Patient Instructions (Signed)
Per skilled sheet 

## 2017-08-06 IMAGING — CT CT ANGIO NECK
2 of 9 series · 16 of 46 positions shown, 18 images · IV contrast (APPLIED)
Comparison: None.

CLINICAL DATA: Possible stroke.  Carotid stenosis.

EXAM:
CT ANGIOGRAPHY NECK
TECHNIQUE: Multidetector CT imaging of the neck was performed using the
standard protocol during bolus administration of intravenous
contrast. Multiplanar CT image reconstructions and MIPs were
obtained to evaluate the vascular anatomy. Carotid stenosis
measurements (when applicable) are obtained utilizing NASCET
criteria, using the distal internal carotid diameter as the
denominator.
CONTRAST:  50mL OMNIPAQUE IOHEXOL 350 MG/ML SOLN

[Series 6: thin axial · axial · 0.38mm/px · z∈[+1217,+1426]mm · 13 of 237 slices shown, 15 images]
[im 14/237  soft-tissue]
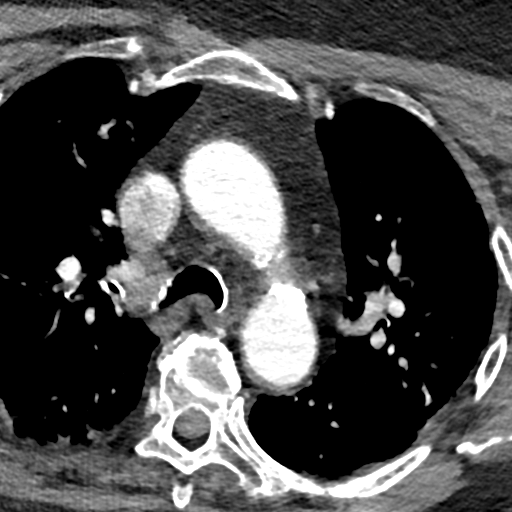
[im 14/237  bone]
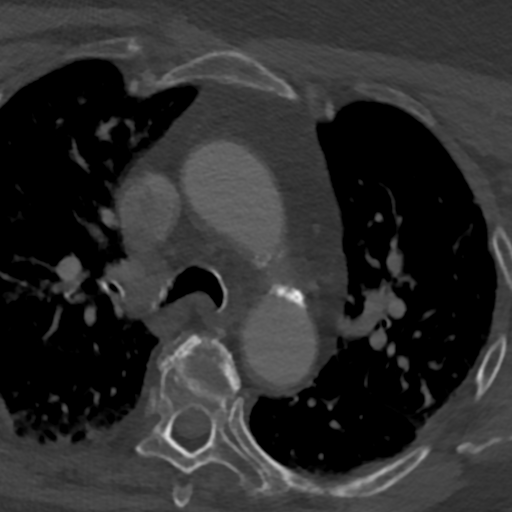
[im 28/237  soft-tissue]
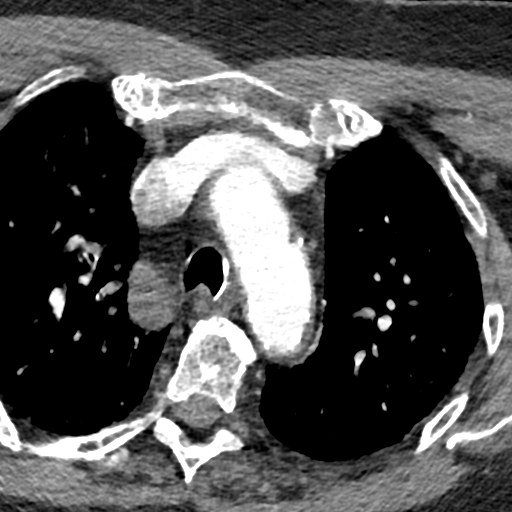
[im 56/237  soft-tissue]
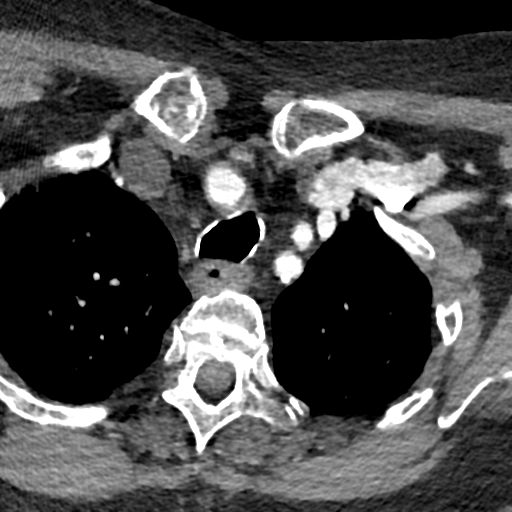
[im 70/237  soft-tissue]
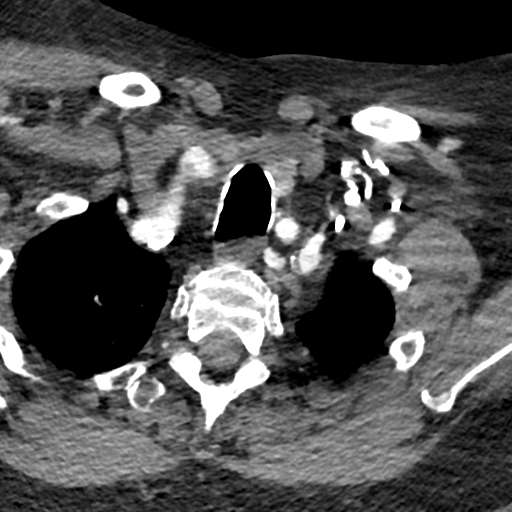
[im 84/237  soft-tissue]
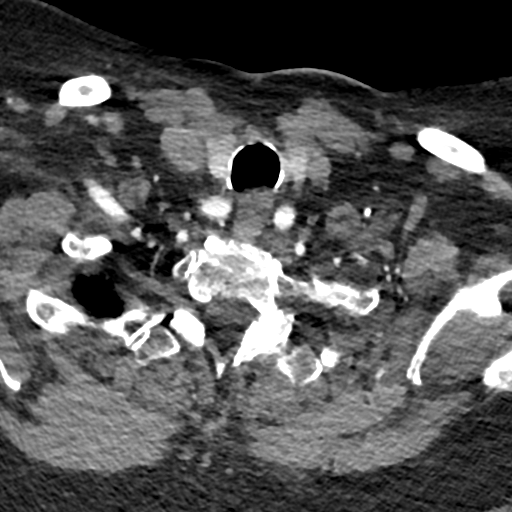
[im 98/237  soft-tissue]
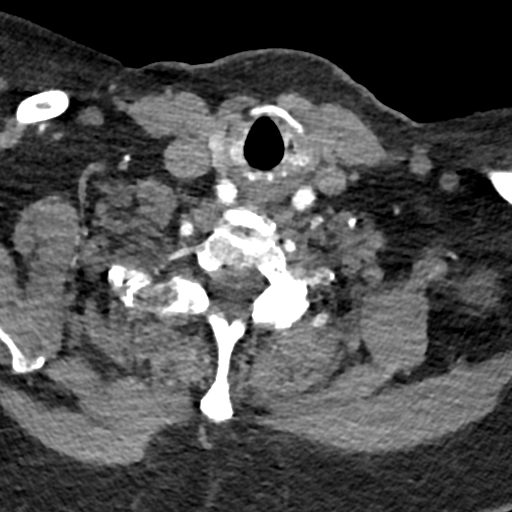
[im 125/237  soft-tissue]
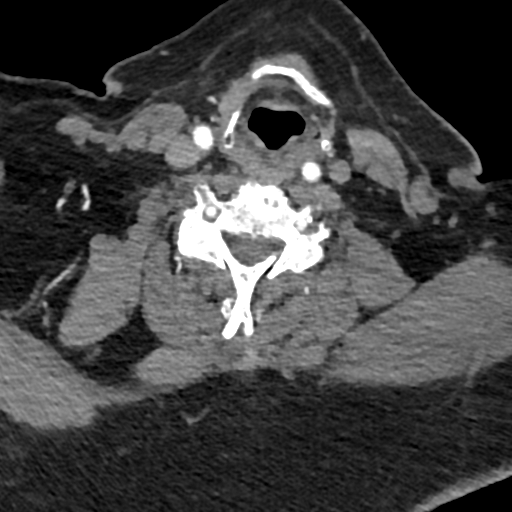
[im 139/237  soft-tissue]
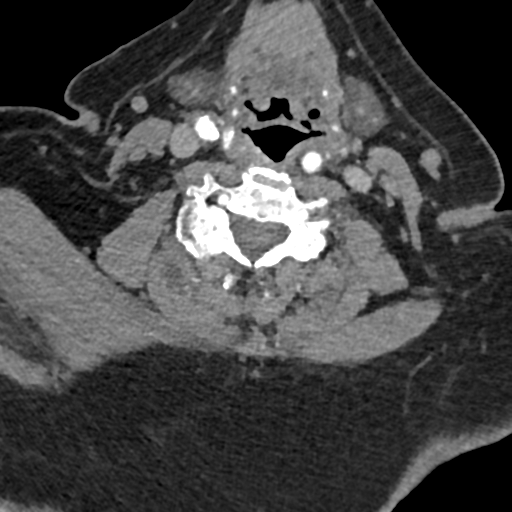
[im 153/237  soft-tissue]
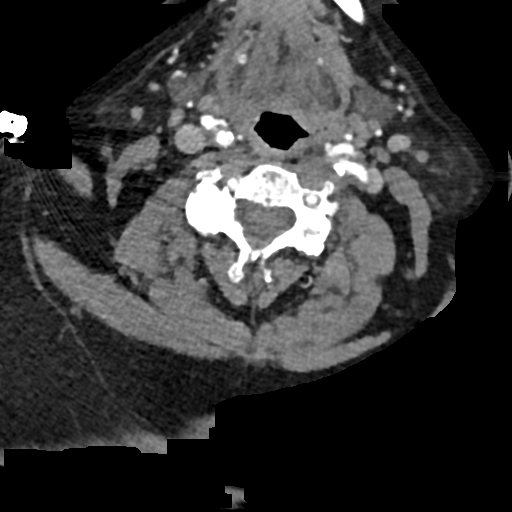
[im 153/237  bone]
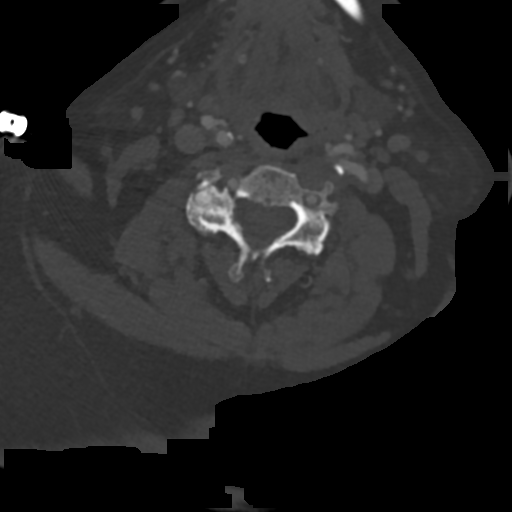
[im 167/237  soft-tissue]
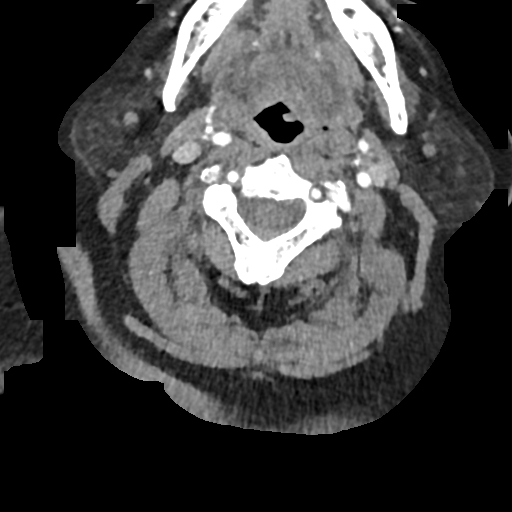
[im 181/237  soft-tissue]
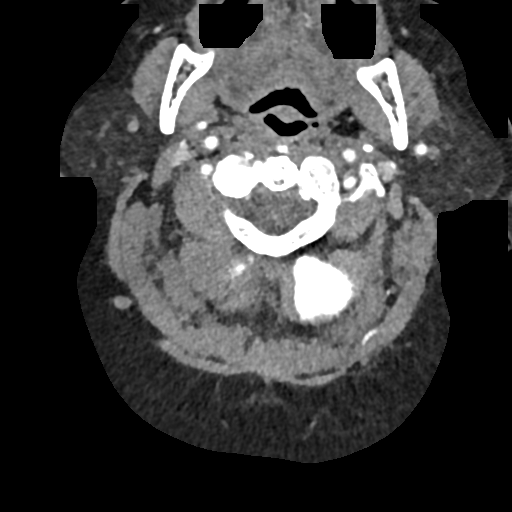
[im 209/237  soft-tissue]
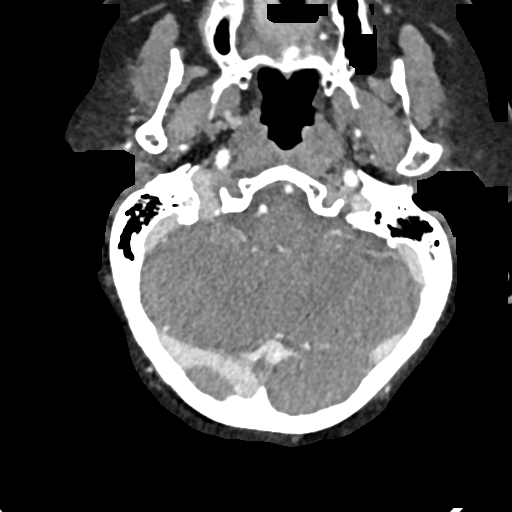
[im 223/237  soft-tissue]
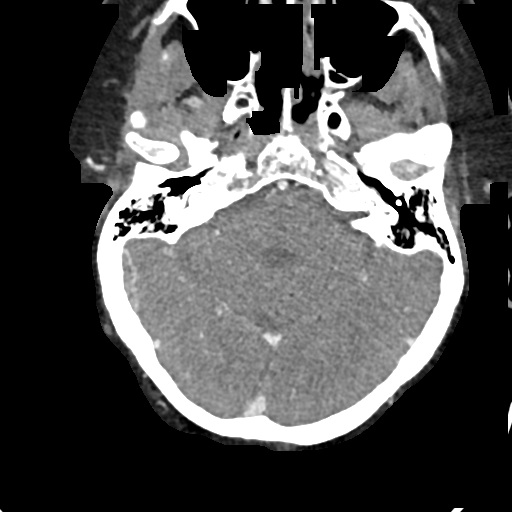

[Series 7: coronal thin · coronal · 0.41mm/px · 3 of 199 slices shown]
[im 57/199  soft-tissue]
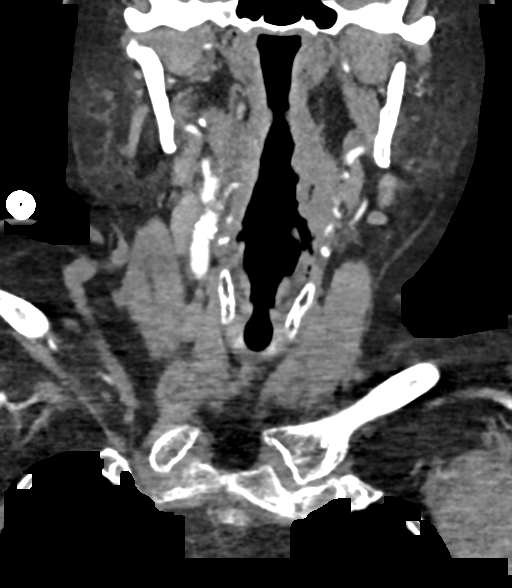
[im 85/199  soft-tissue]
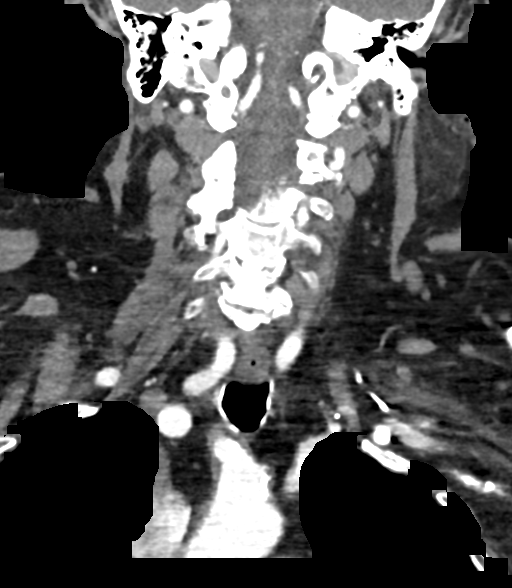
[im 114/199  soft-tissue]
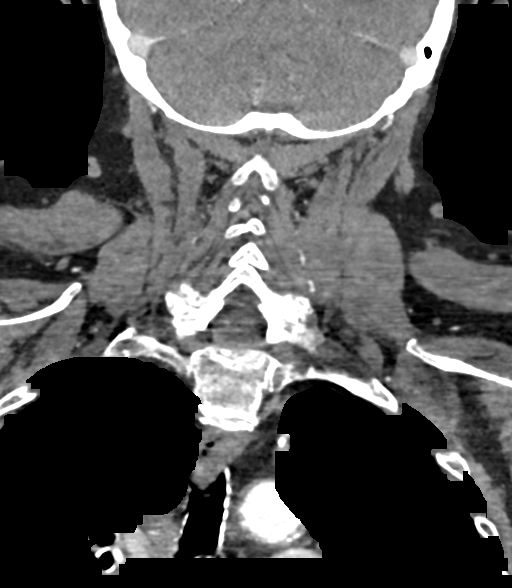

[16 of 46 positions shown; findings below may reference images not displayed]

FINDINGS: Aortic arch: Normal variant aortic arch branching pattern with
common origin of the brachiocephalic and left common carotid
arteries. Moderate arch atherosclerosis. Mild narrowing of the right
cleaning artery origin.

Right carotid system: Medial course of the proximal common carotid
artery. There is moderate calcified plaque about the carotid
bifurcation with a tiny web or flap in the proximal ICA resulting in
50% ICA stenosis. There is moderate ECA origin stenosis.

Left carotid system: Moderate calcified plaque at the carotid
bifurcation results in less than 50% narrowing of the ICA origin.
There is a small flap in the proximal ICA measuring approximately 8
mm in length which may reflect a short segment dissection.

Vertebral arteries: Vertebral arteries are patent and codominant.
Calcified plaque at the right vertebral artery origin results in
mild-to-moderate stenosis. Predominantly noncalcified plaque at the
left vertebral artery origin also results in mild-to-moderate
stenosis.

Skeleton: Moderate multilevel disc degeneration and severe facet
arthrosis in the cervical spine.

Other neck: No neck mass.
IMPRESSION: 1. Moderate carotid atherosclerosis with 8 mm flap in the proximal
left ICA which may reflect a short segment dissection or plaque
ulceration. No significant left ICA stenosis.
2. Tiny web or flap in the proximal right ICA with 50% stenosis.
3. Mild-to-moderate bilateral vertebral artery origins stenoses.
